# Patient Record
Sex: Female | Born: 2000 | Race: White | Hispanic: No | Marital: Single | Smoking: Never smoker
Health system: Southern US, Community
[De-identification: ages and names within clinical notes are randomized; demographics above are authoritative.]

## PROBLEM LIST (undated history)

## (undated) DIAGNOSIS — F32A Depression, unspecified: Secondary | ICD-10-CM

## (undated) DIAGNOSIS — F419 Anxiety disorder, unspecified: Secondary | ICD-10-CM

## (undated) HISTORY — DX: Depression, unspecified: F32.A

## (undated) HISTORY — DX: Anxiety disorder, unspecified: F41.9

## (undated) HISTORY — PX: SPINE SURGERY: SHX786

---
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
## 2020-12-19 ENCOUNTER — Other Ambulatory Visit (HOSPITAL_COMMUNITY): Payer: Self-pay | Admitting: Physical Medicine & Rehabilitation

## 2020-12-19 ENCOUNTER — Other Ambulatory Visit: Payer: Self-pay | Admitting: Physical Medicine & Rehabilitation

## 2020-12-19 DIAGNOSIS — M546 Pain in thoracic spine: Secondary | ICD-10-CM

## 2020-12-19 DIAGNOSIS — M542 Cervicalgia: Secondary | ICD-10-CM

## 2021-01-01 ENCOUNTER — Other Ambulatory Visit: Payer: Self-pay

## 2021-01-01 ENCOUNTER — Ambulatory Visit
Admission: RE | Admit: 2021-01-01 | Discharge: 2021-01-01 | Disposition: A | Discharge status: Home / Self Care | Payer: 59 | Source: Ambulatory Visit | Attending: Physical Medicine & Rehabilitation | Admitting: Physical Medicine & Rehabilitation

## 2021-01-01 DIAGNOSIS — M542 Cervicalgia: Secondary | ICD-10-CM | POA: Diagnosis present

## 2021-01-01 DIAGNOSIS — M546 Pain in thoracic spine: Secondary | ICD-10-CM | POA: Insufficient documentation

## 2021-01-01 IMAGING — MR MR THORACIC SPINE W/O CM
6 series · 31 of 48 positions shown · non-contrast
Comparison: Spine radiographs [DATE] (report only, images not
available)

CLINICAL DATA: Back pain for 4 years, history of thoracic spine
surgery in [Y8]

EXAM:
MRI CERVICAL AND THORACIC SPINE WITHOUT CONTRAST
TECHNIQUE: Multiplanar and multiecho pulse sequences of the cervical spine, to
include the craniocervical junction and cervicothoracic junction,
and the thoracic spine, were obtained without intravenous contrast.

[Series 18: T1 · sagittal · 6.0mm · 1.41mm/px · 4 of 9 slices shown]
[im 1/9]
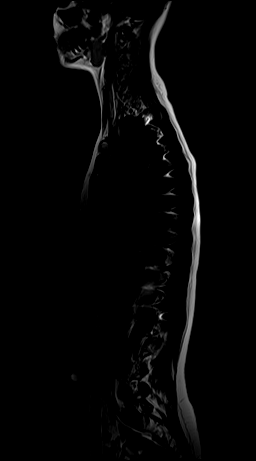
[im 3/9]
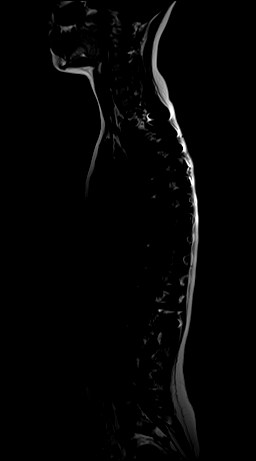
[im 6/9]
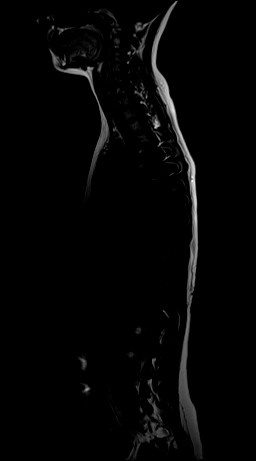
[im 9/9]
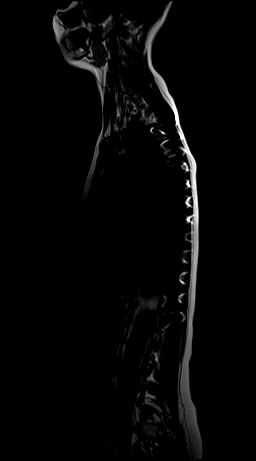

[Series 19: t2_tse_warp_sag · sagittal · 4.0mm · 0.89mm/px · 4 of 15 slices shown]
[im 1/15]
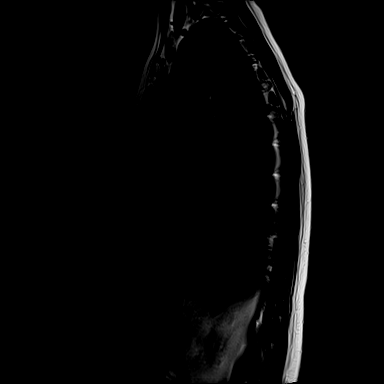
[im 5/15]
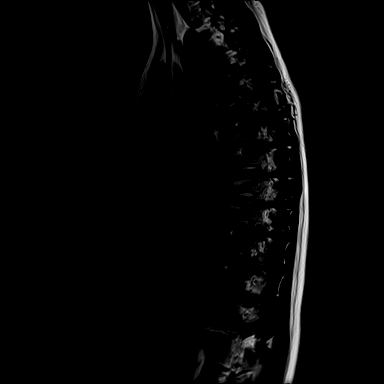
[im 10/15]
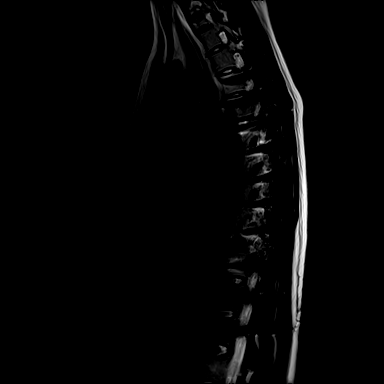
[im 15/15]
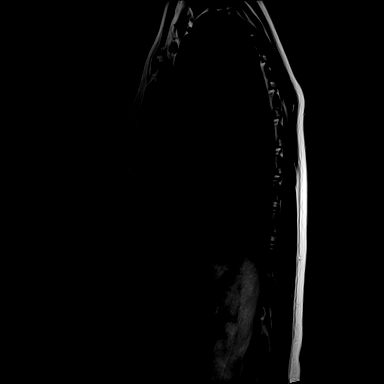

[Series 20: t2_tse_stir_warp_sag · sagittal · 4.0mm · 1.06mm/px · 4 of 15 slices shown]
[im 1/15]
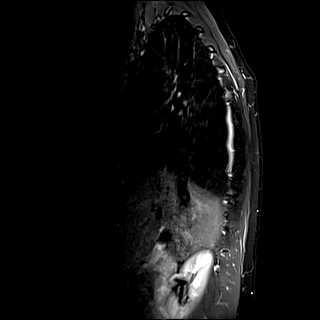
[im 5/15]
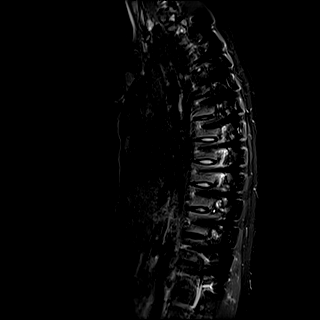
[im 10/15]
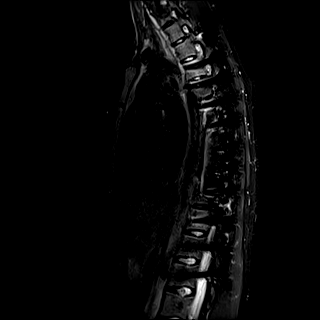
[im 15/15]
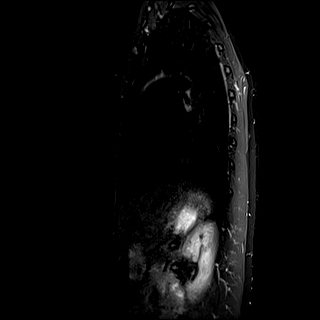

[Series 21: t1_tse_warp_sag · sagittal · 4.0mm · 0.89mm/px · 4 of 15 slices shown]
[im 1/15]
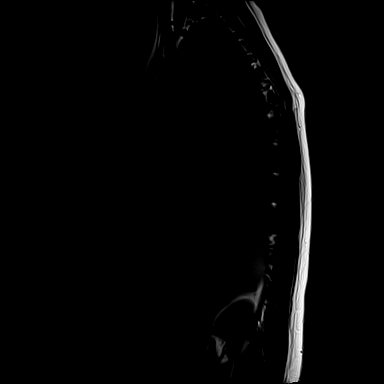
[im 5/15]
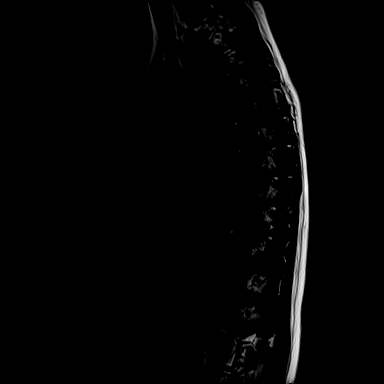
[im 10/15]
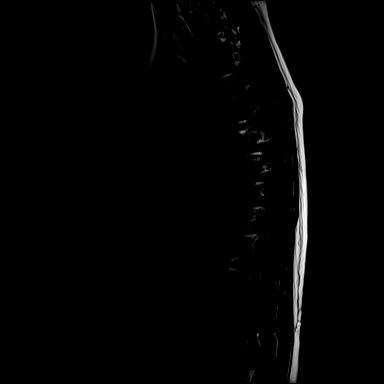
[im 15/15]
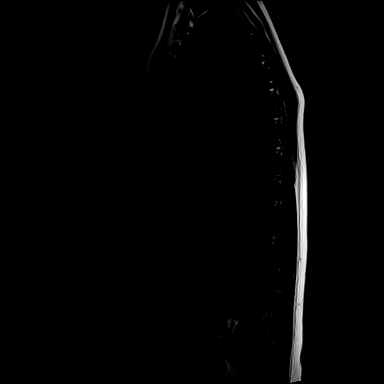

[Series 22: t2_tse_warp_tra · axial · 4.0mm · 0.62mm/px · z∈[-186,+64]mm · 8 of 54 slices shown]
[im 4/54]
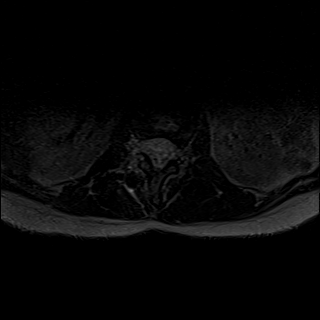
[im 11/54]
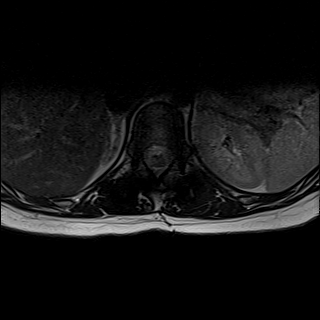
[im 18/54]
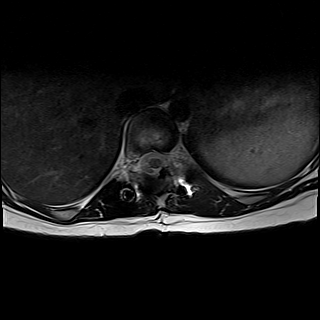
[im 25/54]
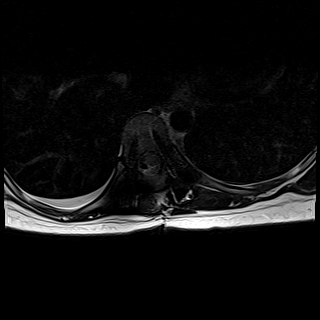
[im 32/54]
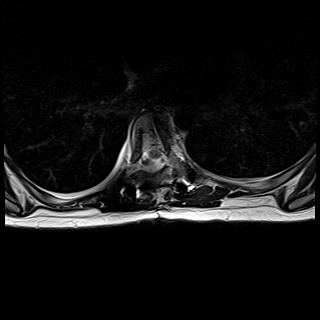
[im 39/54]
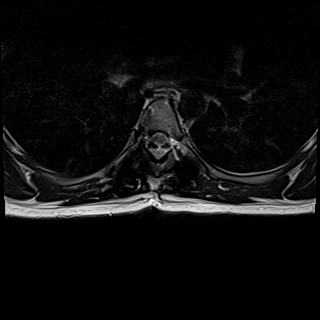
[im 46/54]
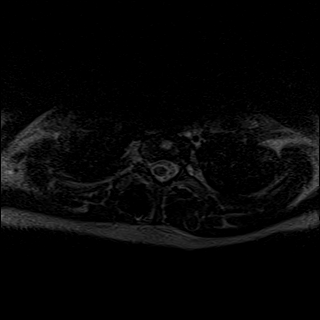
[im 54/54]
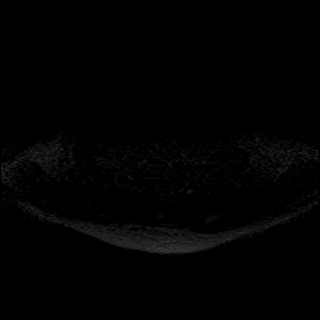

[Series 23: GRE · axial · 4.0mm · 0.37mm/px · z∈[-186,+41]mm · 7 of 54 slices shown]
[im 4/54]
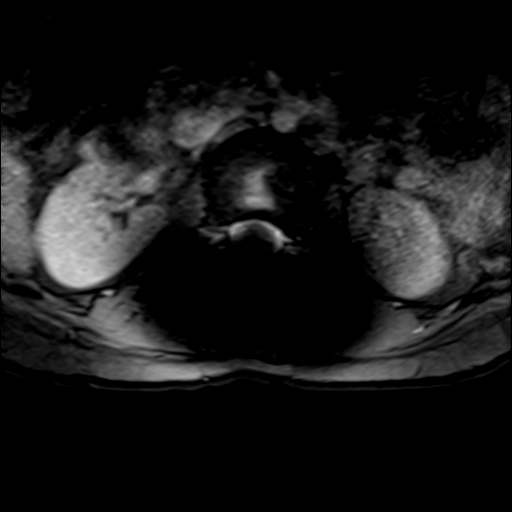
[im 11/54]
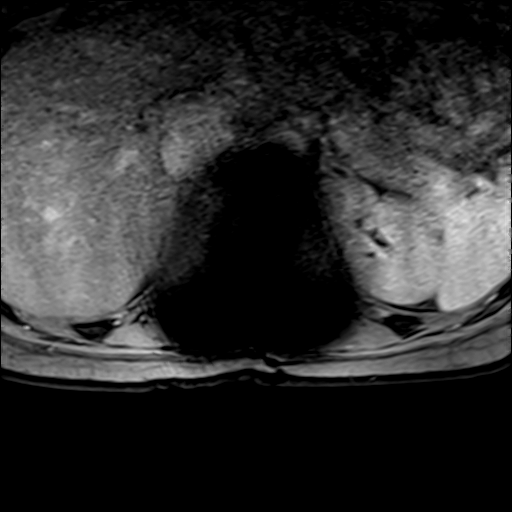
[im 18/54]
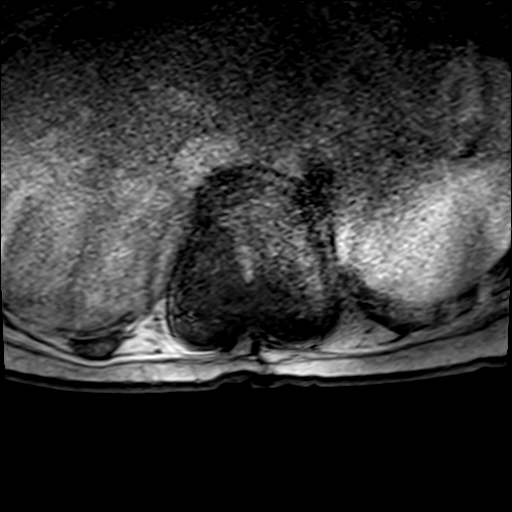
[im 25/54]
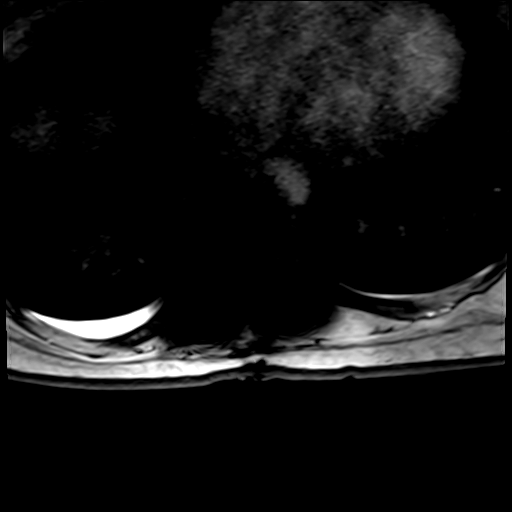
[im 32/54]
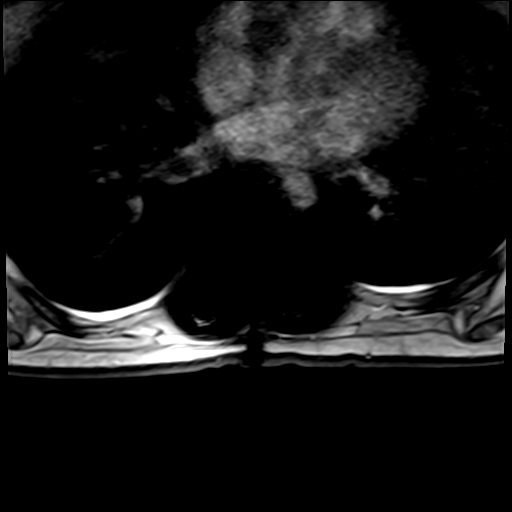
[im 39/54]
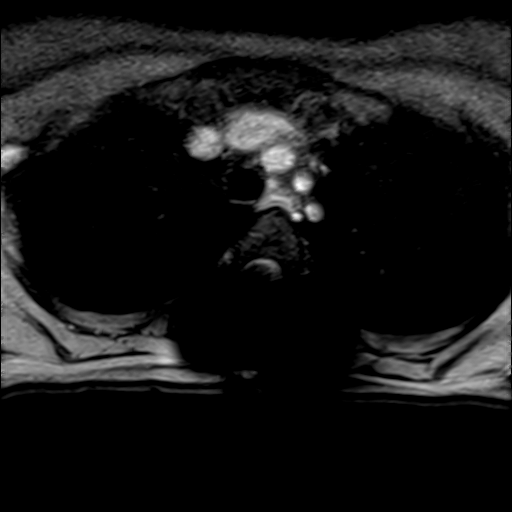
[im 46/54]
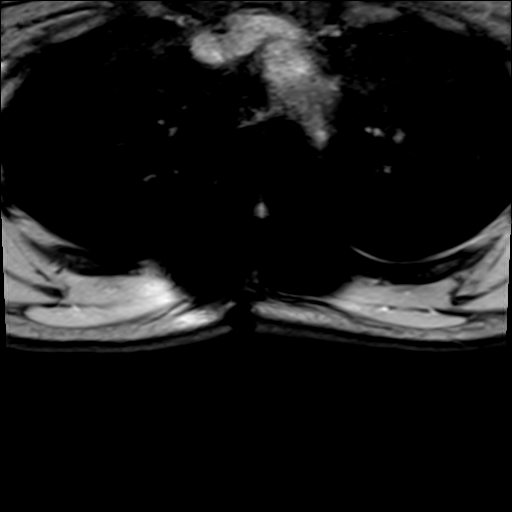

[31 of 48 positions shown; findings below may reference images not displayed]

FINDINGS: MRI CERVICAL SPINE FINDINGS

Alignment: Normal.

Vertebrae: Vertebral body heights are preserved. There is no
suspicious marrow signal abnormality.

Cord: Normal in signal and morphology.

Posterior Fossa, vertebral arteries, paraspinal tissues: Imaged
posterior fossa is unremarkable. The paraspinal soft tissues are
unremarkable. The vertebral artery flow voids are present.

Disc levels:

The intervertebral disc spaces are preserved. There is no disc
herniation. There is no significant spinal canal or neural foraminal
stenosis.

MRI THORACIC SPINE FINDINGS

Alignment: There is S shaped scoliosis of the thoracic spine with
levoscoliosis centered at T3-T4 and dextroscoliosis centered at
T8-T9. There is no antero or retrolisthesis.

Vertebrae: Postsurgical changes reflecting posterior fusion spanning
from T4 through L1 are seen. Hardware alignment is grossly within
expected limits. Vertebral body heights are preserved. There is no
marrow signal abnormality.

Cord: No cord signal abnormality is identified. The cord is normal
in morphology.

Paraspinal and other soft tissues: There are small right and trace
left pleural effusions. The paraspinal soft tissues are
unremarkable.

Disc levels:

The disc spaces are preserved. There is no disc herniation. There is
no significant spinal canal or neural foraminal stenosis.
IMPRESSION: 1. Unremarkable cervical spine MRI.
2. Status post posterior fusion spanning from T4 through L1. There
is residual S shaped scoliosis of the thoracic spine with
levoscoliosis centered at T3-T4 and dextroscoliosis centered at
T8-T9.
3. Otherwise, no acute findings in the thoracic spine. No
significant spinal canal or neural foraminal stenosis.
4. Small right and trace left pleural effusions.

## 2021-01-01 IMAGING — MR MR CERVICAL SPINE W/O CM
5 series · 38 of 48 positions shown · non-contrast
Comparison: Spine radiographs [DATE] (report only, images not
available)

CLINICAL DATA: Back pain for 4 years, history of thoracic spine
surgery in [Y8]

EXAM:
MRI CERVICAL AND THORACIC SPINE WITHOUT CONTRAST
TECHNIQUE: Multiplanar and multiecho pulse sequences of the cervical spine, to
include the craniocervical junction and cervicothoracic junction,
and the thoracic spine, were obtained without intravenous contrast.

[Series 1: T2 · sagittal · 3.0mm · 0.62mm/px · 6 of 15 slices shown (1 of 2)]
[im 1/15]
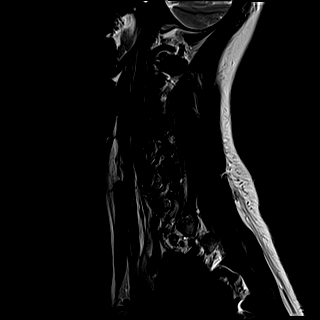
[im 3/15]
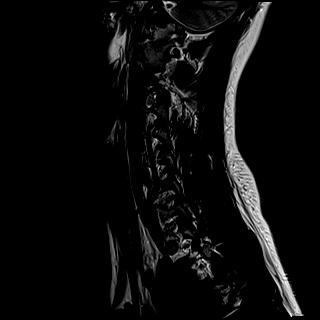
[im 6/15]
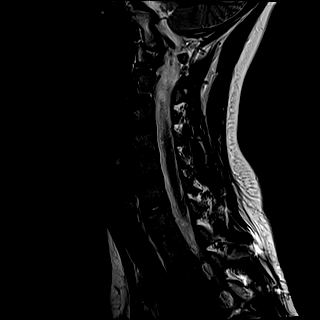
[im 9/15]
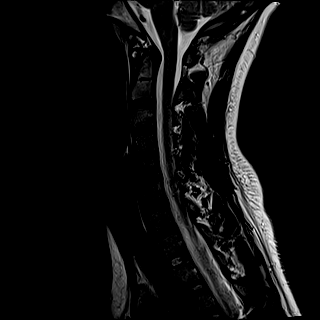
[im 12/15]
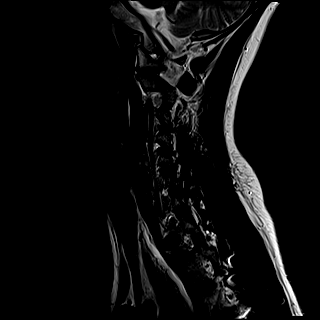
[im 15/15]
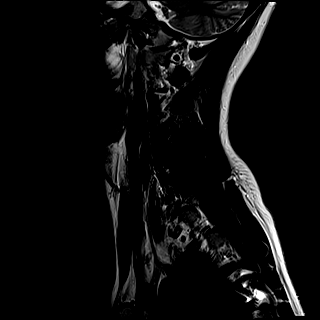

[Series 2: FLAIR · sagittal · 3.0mm · 0.78mm/px · 7 of 15 slices shown]
[im 1/15]
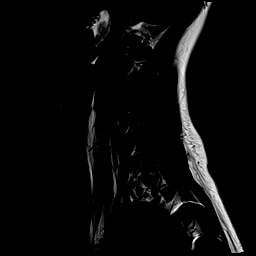
[im 3/15]
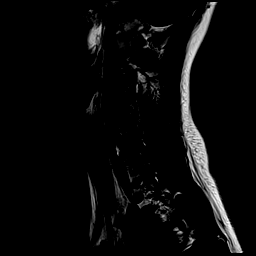
[im 5/15]
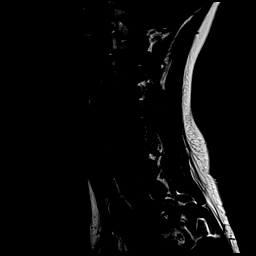
[im 8/15]
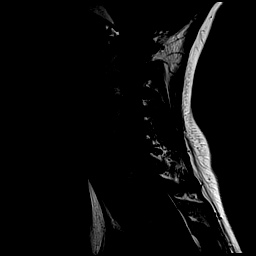
[im 10/15]
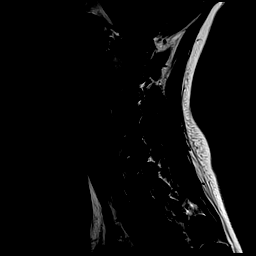
[im 12/15]
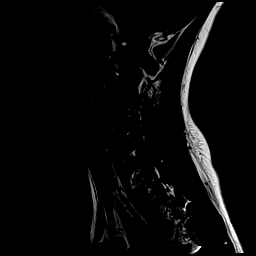
[im 15/15]
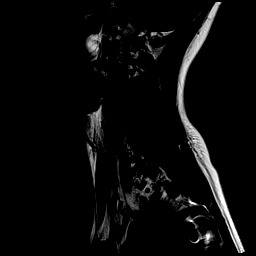

[Series 3: STIR · sagittal · 3.0mm · 0.62mm/px · 7 of 15 slices shown]
[im 1/15]
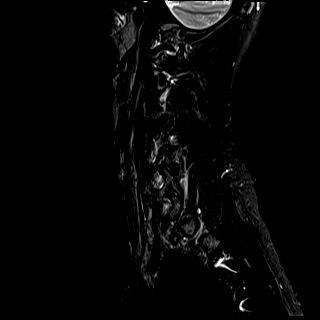
[im 3/15]
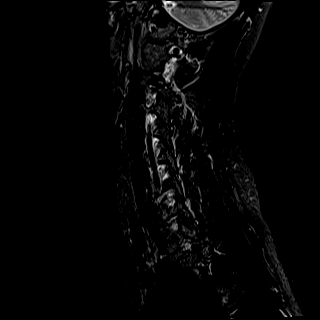
[im 5/15]
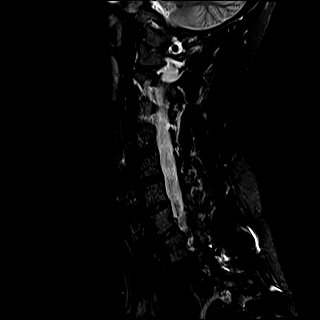
[im 8/15]
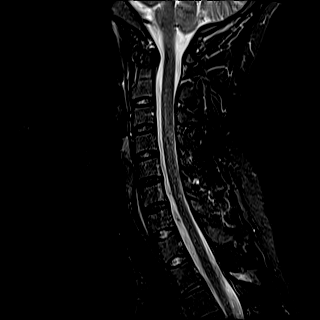
[im 10/15]
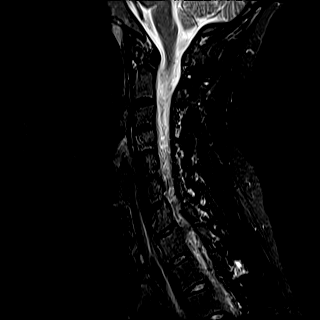
[im 12/15]
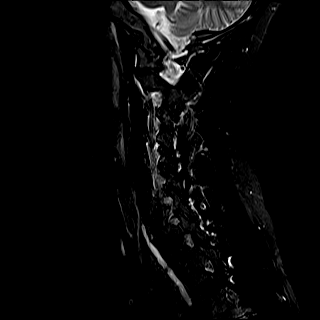
[im 15/15]
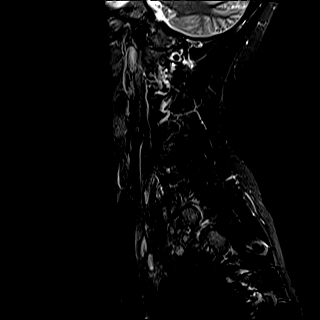

[Series 4: T2 · axial · 3.0mm · 0.70mm/px · z∈[+80,+177]mm · 10 of 29 slices shown (2 of 2)]
[im 1/29]
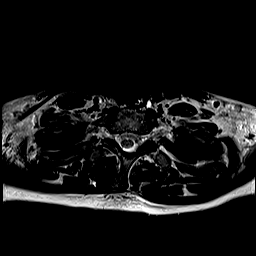
[im 3/29]
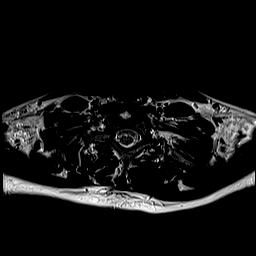
[im 5/29]
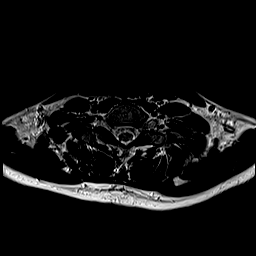
[im 7/29]
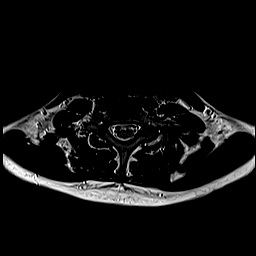
[im 9/29]
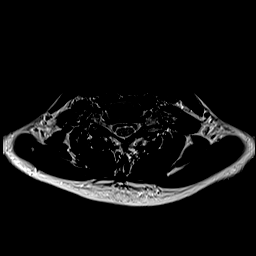
[im 13/29]
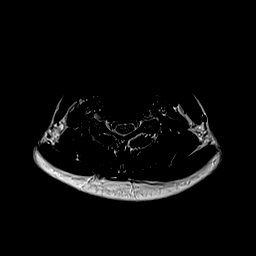
[im 16/29]
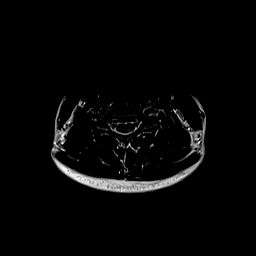
[im 20/29]
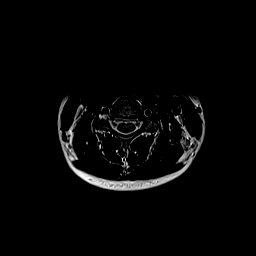
[im 24/29]
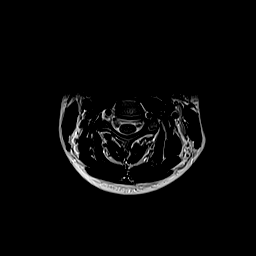
[im 29/29]
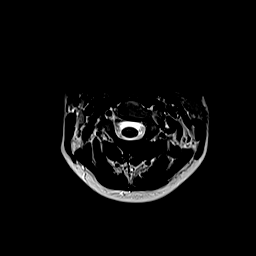

[Series 5: ax mpgr · axial · 3.0mm · 0.35mm/px · z∈[+80,+177]mm · 8 of 29 slices shown]
[im 1/29]
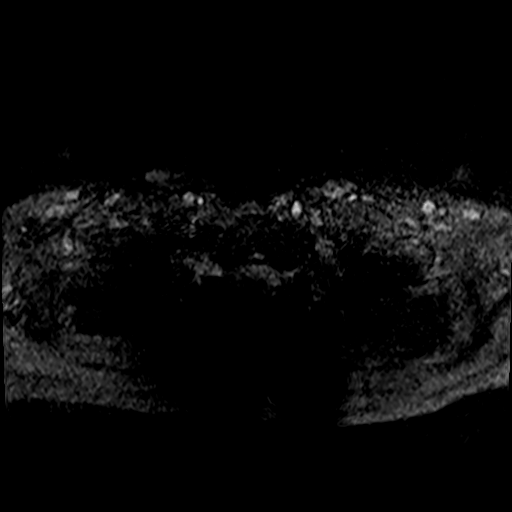
[im 5/29]
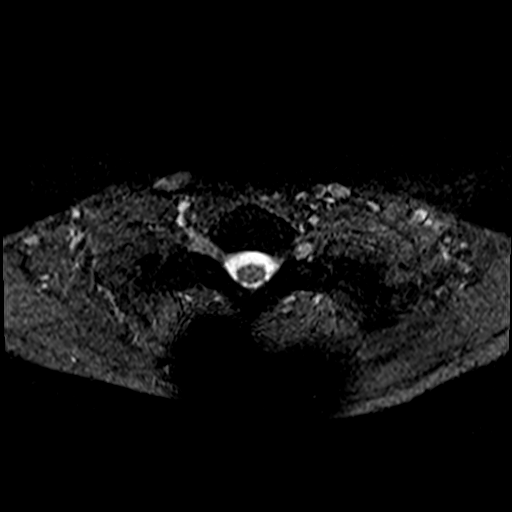
[im 9/29]
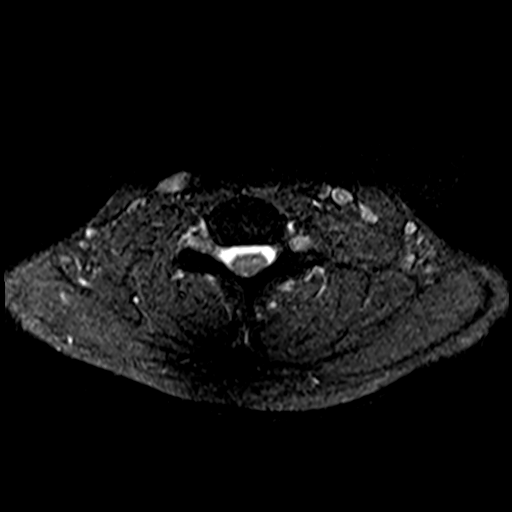
[im 13/29]
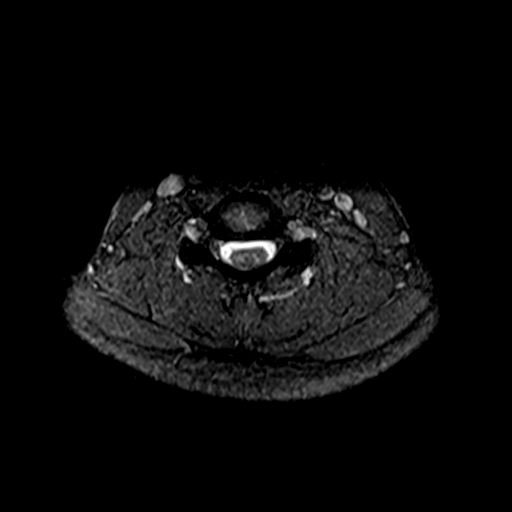
[im 16/29]
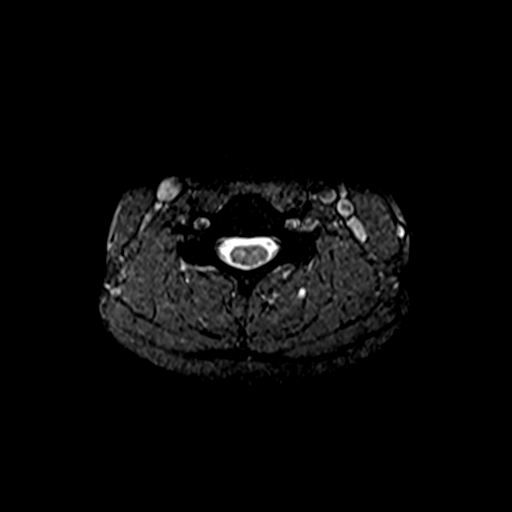
[im 20/29]
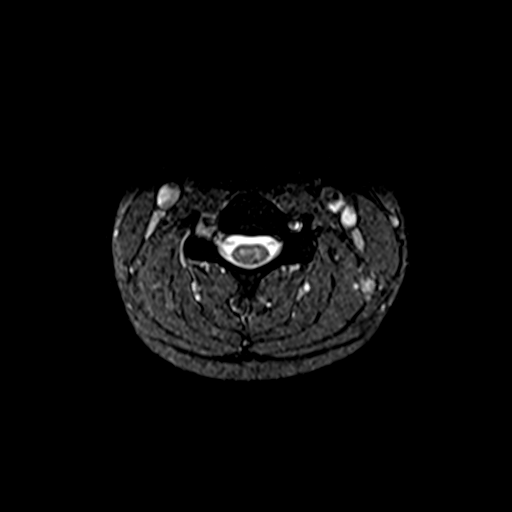
[im 24/29]
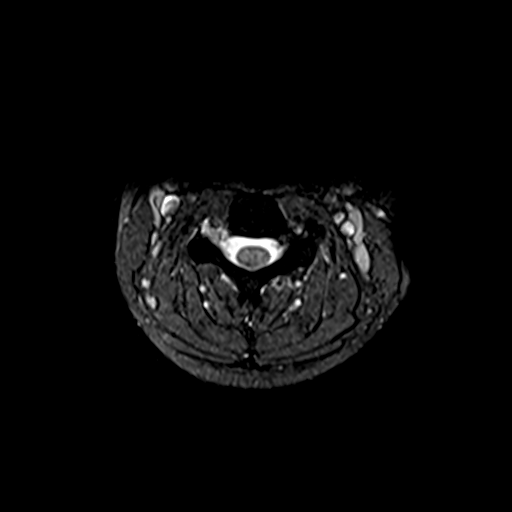
[im 29/29]
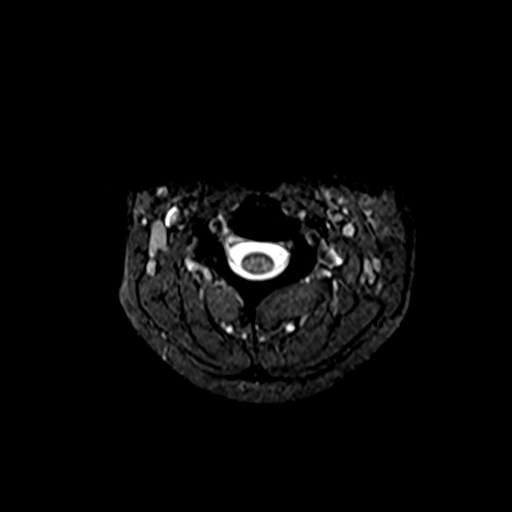

[38 of 48 positions shown; findings below may reference images not displayed]

FINDINGS: MRI CERVICAL SPINE FINDINGS

Alignment: Normal.

Vertebrae: Vertebral body heights are preserved. There is no
suspicious marrow signal abnormality.

Cord: Normal in signal and morphology.

Posterior Fossa, vertebral arteries, paraspinal tissues: Imaged
posterior fossa is unremarkable. The paraspinal soft tissues are
unremarkable. The vertebral artery flow voids are present.

Disc levels:

The intervertebral disc spaces are preserved. There is no disc
herniation. There is no significant spinal canal or neural foraminal
stenosis.

MRI THORACIC SPINE FINDINGS

Alignment: There is S shaped scoliosis of the thoracic spine with
levoscoliosis centered at T3-T4 and dextroscoliosis centered at
T8-T9. There is no antero or retrolisthesis.

Vertebrae: Postsurgical changes reflecting posterior fusion spanning
from T4 through L1 are seen. Hardware alignment is grossly within
expected limits. Vertebral body heights are preserved. There is no
marrow signal abnormality.

Cord: No cord signal abnormality is identified. The cord is normal
in morphology.

Paraspinal and other soft tissues: There are small right and trace
left pleural effusions. The paraspinal soft tissues are
unremarkable.

Disc levels:

The disc spaces are preserved. There is no disc herniation. There is
no significant spinal canal or neural foraminal stenosis.
IMPRESSION: 1. Unremarkable cervical spine MRI.
2. Status post posterior fusion spanning from T4 through L1. There
is residual S shaped scoliosis of the thoracic spine with
levoscoliosis centered at T3-T4 and dextroscoliosis centered at
T8-T9.
3. Otherwise, no acute findings in the thoracic spine. No
significant spinal canal or neural foraminal stenosis.
4. Small right and trace left pleural effusions.

## 2022-03-21 DIAGNOSIS — M4325 Fusion of spine, thoracolumbar region: Secondary | ICD-10-CM | POA: Insufficient documentation

## 2022-08-07 DIAGNOSIS — M549 Dorsalgia, unspecified: Secondary | ICD-10-CM | POA: Insufficient documentation

## 2022-10-16 DIAGNOSIS — F331 Major depressive disorder, recurrent, moderate: Secondary | ICD-10-CM | POA: Insufficient documentation

## 2022-10-16 DIAGNOSIS — F411 Generalized anxiety disorder: Secondary | ICD-10-CM | POA: Insufficient documentation

## 2023-01-22 ENCOUNTER — Other Ambulatory Visit: Payer: Self-pay | Admitting: Internal Medicine

## 2023-01-22 DIAGNOSIS — M542 Cervicalgia: Secondary | ICD-10-CM

## 2023-01-22 DIAGNOSIS — M545 Low back pain, unspecified: Secondary | ICD-10-CM

## 2023-01-28 ENCOUNTER — Ambulatory Visit
Admission: RE | Admit: 2023-01-28 | Discharge: 2023-01-28 | Disposition: A | Discharge status: Home / Self Care | Payer: BC Managed Care – PPO | Source: Ambulatory Visit | Attending: Internal Medicine | Admitting: Internal Medicine

## 2023-01-28 DIAGNOSIS — M542 Cervicalgia: Secondary | ICD-10-CM | POA: Diagnosis present

## 2023-01-28 DIAGNOSIS — G8929 Other chronic pain: Secondary | ICD-10-CM | POA: Insufficient documentation

## 2023-01-28 DIAGNOSIS — M545 Low back pain, unspecified: Secondary | ICD-10-CM | POA: Insufficient documentation

## 2023-02-09 ENCOUNTER — Encounter: Payer: Self-pay | Admitting: Student in an Organized Health Care Education/Training Program

## 2023-02-09 ENCOUNTER — Ambulatory Visit
Payer: BC Managed Care – PPO | Attending: Student in an Organized Health Care Education/Training Program | Admitting: Student in an Organized Health Care Education/Training Program

## 2023-02-09 VITALS — BP 107/75 | HR 87 | Temp 97.1°F | Resp 16 | Ht 61.0 in | Wt 116.3 lb

## 2023-02-09 DIAGNOSIS — G8928 Other chronic postprocedural pain: Secondary | ICD-10-CM

## 2023-02-09 DIAGNOSIS — M549 Dorsalgia, unspecified: Secondary | ICD-10-CM | POA: Diagnosis not present

## 2023-02-09 DIAGNOSIS — M7918 Myalgia, other site: Secondary | ICD-10-CM

## 2023-02-09 DIAGNOSIS — M4325 Fusion of spine, thoracolumbar region: Secondary | ICD-10-CM

## 2023-02-09 DIAGNOSIS — F331 Major depressive disorder, recurrent, moderate: Secondary | ICD-10-CM | POA: Diagnosis not present

## 2023-02-09 DIAGNOSIS — M41125 Adolescent idiopathic scoliosis, thoracolumbar region: Secondary | ICD-10-CM | POA: Diagnosis present

## 2023-02-09 DIAGNOSIS — F411 Generalized anxiety disorder: Secondary | ICD-10-CM

## 2023-02-09 NOTE — Patient Instructions (Signed)
GENERAL RISKS AND COMPLICATIONS ° °What are the risk, side effects and possible complications? °Generally speaking, most procedures are safe.  However, with any procedure there are risks, side effects, and the possibility of complications.  The risks and complications are dependent upon the sites that are lesioned, or the type of nerve block to be performed.  The closer the procedure is to the spine, the more serious the risks are.  Great care is taken when placing the radio frequency needles, block needles or lesioning probes, but sometimes complications can occur. °Infection: Any time there is an injection through the skin, there is a risk of infection.  This is why sterile conditions are used for these blocks.  There are four possible types of infection. °Localized skin infection. °Central Nervous System Infection-This can be in the form of Meningitis, which can be deadly. °Epidural Infections-This can be in the form of an epidural abscess, which can cause pressure inside of the spine, causing compression of the spinal cord with subsequent paralysis. This would require an emergency surgery to decompress, and there are no guarantees that the patient would recover from the paralysis. °Discitis-This is an infection of the intervertebral discs.  It occurs in about 1% of discography procedures.  It is difficult to treat and it may lead to surgery. ° °      2. Pain: the needles have to go through skin and soft tissues, will cause soreness. °      3. Damage to internal structures:  The nerves to be lesioned may be near blood vessels or   ° other nerves which can be potentially damaged. °      4. Bleeding: Bleeding is more common if the patient is taking blood thinners such as  aspirin, Coumadin, Ticiid, Plavix, etc., or if he/she have some genetic predisposition  such as hemophilia. Bleeding into the spinal canal can cause compression of the spinal  cord with subsequent paralysis.  This would require an emergency  surgery to  decompress and there are no guarantees that the patient would recover from the  paralysis. °      5. Pneumothorax:  Puncturing of a lung is a possibility, every time a needle is introduced in  the area of the chest or upper back.  Pneumothorax refers to free air around the  collapsed lung(s), inside of the thoracic cavity (chest cavity).  Another two possible  complications related to a similar event would include: Hemothorax and Chylothorax.   These are variations of the Pneumothorax, where instead of air around the collapsed  lung(s), you may have blood or chyle, respectively. °      6. Spinal headaches: They may occur with any procedures in the area of the spine. °      7. Persistent CSF (Cerebro-Spinal Fluid) leakage: This is a rare problem, but may occur  with prolonged intrathecal or epidural catheters either due to the formation of a fistulous  track or a dural tear. °      8. Nerve damage: By working so close to the spinal cord, there is always a possibility of  nerve damage, which could be as serious as a permanent spinal cord injury with  paralysis. °      9. Death:  Although rare, severe deadly allergic reactions known as "Anaphylactic  reaction" can occur to any of the medications used. °     10. Worsening of the symptoms:  We can always make thing worse. ° °What are the chances   of something like this happening? °Chances of any of this occuring are extremely low.  By statistics, you have more of a chance of getting killed in a motor vehicle accident: while driving to the hospital than any of the above occurring .  Nevertheless, you should be aware that they are possibilities.  In general, it is similar to taking a shower.  Everybody knows that you can slip, hit your head and get killed.  Does that mean that you should not shower again?  Nevertheless always keep in mind that statistics do not mean anything if you happen to be on the wrong side of them.  Even if a procedure has a 1 (one) in a  1,000,000 (million) chance of going wrong, it you happen to be that one..Also, keep in mind that by statistics, you have more of a chance of having something go wrong when taking medications. ° °Who should not have this procedure? °If you are on a blood thinning medication (e.g. Coumadin, Plavix, see list of "Blood Thinners"), or if you have an active infection going on, you should not have the procedure.  If you are taking any blood thinners, please inform your physician. ° °How should I prepare for this procedure? °Do not eat or drink anything at least six hours prior to the procedure. °Bring a driver with you .  It cannot be a taxi. °Come accompanied by an adult that can drive you back, and that is strong enough to help you if your legs get weak or numb from the local anesthetic. °Take all of your medicines the morning of the procedure with just enough water to swallow them. °If you have diabetes, make sure that you are scheduled to have your procedure done first thing in the morning, whenever possible. °If you have diabetes, take only half of your insulin dose and notify our nurse that you have done so as soon as you arrive at the clinic. °If you are diabetic, but only take blood sugar pills (oral hypoglycemic), then do not take them on the morning of your procedure.  You may take them after you have had the procedure. °Do not take aspirin or any aspirin-containing medications, at least eleven (11) days prior to the procedure.  They may prolong bleeding. °Wear loose fitting clothing that may be easy to take off and that you would not mind if it got stained with Betadine or blood. °Do not wear any jewelry or perfume °Remove any nail coloring.  It will interfere with some of our monitoring equipment. ° °NOTE: Remember that this is not meant to be interpreted as a complete list of all possible complications.  Unforeseen problems may occur. ° °BLOOD THINNERS °The following drugs contain aspirin or other products,  which can cause increased bleeding during surgery and should not be taken for 2 weeks prior to and 1 week after surgery.  If you should need take something for relief of minor pain, you may take acetaminophen which is found in Tylenol,m Datril, Anacin-3 and Panadol. It is not blood thinner. The products listed below are.  Do not take any of the products listed below in addition to any listed on your instruction sheet. ° °A.P.C or A.P.C with Codeine Codeine Phosphate Capsules #3 Ibuprofen Ridaura  °ABC compound Congesprin Imuran rimadil  °Advil Cope Indocin Robaxisal  °Alka-Seltzer Effervescent Pain Reliever and Antacid Coricidin or Coricidin-D ° Indomethacin Rufen  °Alka-Seltzer plus Cold Medicine Cosprin Ketoprofen S-A-C Tablets  °Anacin Analgesic Tablets or Capsules Coumadin   Korlgesic Salflex  Anacin Extra Strength Analgesic tablets or capsules CP-2 Tablets Lanoril Salicylate  Anaprox Cuprimine Capsules Levenox Salocol  Anexsia-D Dalteparin Magan Salsalate  Anodynos Darvon compound Magnesium Salicylate Sine-off  Ansaid Dasin Capsules Magsal Sodium Salicylate  Anturane Depen Capsules Marnal Soma  APF Arthritis pain formula Dewitt's Pills Measurin Stanback  Argesic Dia-Gesic Meclofenamic Sulfinpyrazone  Arthritis Bayer Timed Release Aspirin Diclofenac Meclomen Sulindac  Arthritis pain formula Anacin Dicumarol Medipren Supac  Analgesic (Safety coated) Arthralgen Diffunasal Mefanamic Suprofen  Arthritis Strength Bufferin Dihydrocodeine Mepro Compound Suprol  Arthropan liquid Dopirydamole Methcarbomol with Aspirin Synalgos  ASA tablets/Enseals Disalcid Micrainin Tagament  Ascriptin Doan's Midol Talwin  Ascriptin A/D Dolene Mobidin Tanderil  Ascriptin Extra Strength Dolobid Moblgesic Ticlid  Ascriptin with Codeine Doloprin or Doloprin with Codeine Momentum Tolectin  Asperbuf Duoprin Mono-gesic Trendar  Aspergum Duradyne Motrin or Motrin IB Triminicin  Aspirin plain, buffered or enteric coated  Durasal Myochrisine Trigesic  Aspirin Suppositories Easprin Nalfon Trillsate  Aspirin with Codeine Ecotrin Regular or Extra Strength Naprosyn Uracel  Atromid-S Efficin Naproxen Ursinus  Auranofin Capsules Elmiron Neocylate Vanquish  Axotal Emagrin Norgesic Verin  Azathioprine Empirin or Empirin with Codeine Normiflo Vitamin E  Azolid Emprazil Nuprin Voltaren  Bayer Aspirin plain, buffered or children's or timed BC Tablets or powders Encaprin Orgaran Warfarin Sodium  Buff-a-Comp Enoxaparin Orudis Zorpin  Buff-a-Comp with Codeine Equegesic Os-Cal-Gesic   Buffaprin Excedrin plain, buffered or Extra Strength Oxalid   Bufferin Arthritis Strength Feldene Oxphenbutazone   Bufferin plain or Extra Strength Feldene Capsules Oxycodone with Aspirin   Bufferin with Codeine Fenoprofen Fenoprofen Pabalate or Pabalate-SF   Buffets II Flogesic Panagesic   Buffinol plain or Extra Strength Florinal or Florinal with Codeine Panwarfarin   Buf-Tabs Flurbiprofen Penicillamine   Butalbital Compound Four-way cold tablets Penicillin   Butazolidin Fragmin Pepto-Bismol   Carbenicillin Geminisyn Percodan   Carna Arthritis Reliever Geopen Persantine   Carprofen Gold's salt Persistin   Chloramphenicol Goody's Phenylbutazone   Chloromycetin Haltrain Piroxlcam   Clmetidine heparin Plaquenil   Cllnoril Hyco-pap Ponstel   Clofibrate Hydroxy chloroquine Propoxyphen         Before stopping any of these medications, be sure to consult the physician who ordered them.  Some, such as Coumadin (Warfarin) are ordered to prevent or treat serious conditions such as "deep thrombosis", "pumonary embolisms", and other heart problems.  The amount of time that you may need off of the medication may also vary with the medication and the reason for which you were taking it.  If you are taking any of these medications, please make sure you notify your pain physician before you undergo any procedures.         Trigger Point  Injections Patient Information  Description: Trigger points are areas of muscle sensitive to touch which cause pain with movement, sometimes felt some distance from the site of palpation.  Usually the muscle containing these trigger points if felt as a tight band or knot.   The area of maximum tenderness or trigger point is identified, and after antiseptic preparation of the skin, a small needle is placed into this site.  Reproduction of the pain often occurs and numbing medicine (local anesthetic) is injected into the site, sometimes along with steroid preparation.  The entire block usually lasts less than 5 minutes.  Conditions which may be treated by trigger points:  Muscular pain and spasm Nerve irritation  Preparation for the injection:  Do not eat any solid food or dairy products within 8 hours  of your appointment. You may drink clear liquids up to 3 hours before appointment.  Clear liquids include water, black coffee, juice or soda.  No milk or cream please. You may take your regular medications, including pain medications, with a sip of water before your appointment.  Diabetics should hold regular insulin ( if take separately) and take 1/2 normal NPH dose the morning of the procedure.  Carry some sugar containing items with you to your appointment. A driver must accompany you and be prepared to drive you home after your procedure.  Bring all your current medications with you. An IV may be inserted and sedation may be given at the discretion of the physician.  A blood pressure cuff, EKG, and other monitors will often be applied during the procedure.  Some patients may need to have extra oxygen administered for a short period. You will be asked to provide medical information, including your allergies and medications, prior to the procedure.  We must know immediately if you are taking blood thinners (like Coumadin/Warfarin) or if you are allergic to IV iodine contrast (dye).  We must know if  you could possibly be pregnant.  Possible side-effects:  Bleeding from needle site Infection (rare, may require surgery) Nerve injury (rare) Numbness & tingling (temporary) Punctured lung (if injection around chest) Light-headedness (temporary) Pain at injection site (several days) Decreased blood pressure (rare, temporary) Weakness in arm/leg (temporary)  Call if you experience:  Hive or difficulty breathing (go to the emergency room) Inflammation or drainage at the injection site(s)  Please note:  Although the local anesthetic injected can often make your painful muscle feel good for several hours after the injection, the pain may return.  It takes 3-7 days for steroids to work.  You may not notice any pain relief for at least one week.  If effective, we will often do a series of injections spaced 3-6 weeks apart to maximally decrease your pain.  If you have any questions please call [phone] [facility]

## 2023-02-09 NOTE — Progress Notes (Signed)
 Patient: Patricia Joyce  Service Category: E/M  Provider: Wallie Sherry, MD  DOB: 02-28-2000  DOS: 02/09/2023  Referring Provider: Sherial Bail, MD  MRN: [id number]  Setting: Ambulatory outpatient  PCP: Sherial Bail, MD  Type: New Patient  Specialty: Interventional Pain Management    Location: Office  Delivery: Face-to-face     Primary Reason(s) for Visit: Encounter for initial evaluation of one or more chronic problems (new to examiner) potentially causing chronic pain, and posing a threat to normal musculoskeletal function. (Level of risk: High) CC: Back Pain, Neck Pain, and Shoulder Pain (left)  HPI  Patricia Joyce is a 22 y.o. year old, female patient, who comes for the first time to our practice referred by Sherial Bail, MD for our initial evaluation of her chronic pain. 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 Today she comes in for evaluation of her Back Pain, Neck Pain, and Shoulder Pain (left)  Pain Assessment: Location: Lower, Mid, Upper Back Radiating: to neck, head and left shoulder Onset: More than a month ago Duration: Chronic pain Quality: Aching, Dull, Burning (pinching) Severity: 7 /10 (subjective, self-reported pain score)  Effect on ADL: limits daily activities Timing: Constant Modifying factors: nothing helps BP: 107/75  HR: 87  Onset and Duration: Sudden and Present longer than 3 months Cause of pain: Surgery Severity: Getting worse, No change since onset, NAS-11 at its worse: 8/10, NAS-11 at its best: 5/10, NAS-11 now: 7/10, and NAS-11 on the average: 7/10 Timing: Night, During activity or exercise, and After activity or exercise Aggravating Factors: Motion, Prolonged sitting, Prolonged standing, Surgery made it worse, and  Working Alleviating Factors: Lying down Associated Problems: Depression, Fatigue, Personality changes, Sadness, Spasms, Sweating, Pain that wakes patient up, and Pain that does not allow patient to sleep Quality of Pain: Aching, Agonizing, Annoying, Burning, Constant, Deep, Distressing, Dreadful, Dull, Exhausting, Feeling of constriction, Horrible, Pressure-like, Pulsating, Punishing, Throbbing, and Uncomfortable Previous Examinations or Tests: CT scan, Nerve block, X-rays, and Chiropractic evaluation Previous Treatments: Biofeedback, Chiropractic manipulations, Facet blocks, Physical Therapy, Relaxation therapy, Strengthening exercises, Stretching exercises, and TENS  Patricia Joyce is being evaluated for possible interventional pain management therapies for the treatment of her chronic pain.  Discussed the use of AI scribe software for clinical note transcription with the patient, who gave verbal consent to proceed.  History of Present Illness   Patricia Joyce, a 22 year old female, presents with chronic back pain extending from her lower spine to her neck and occiput, radiating to her left shoulder. The pain began following a thoracic  (T4-L1)spinal fusion surgery performed in 2018 to correct severe scoliosis. Since the surgery, she has experienced a constant dull ache, which has recently evolved into a persistent pinching sensation. This new symptom began in June, subsided after a week, but returned in September during her menstrual cycle and has been constant since. The pain occasionally radiates to her mid-left flank and she reports a sensation of a knot in her left shoulder.  There has been discussion about potential loosening of the surgical hardware, and she is scheduled for surgery at the end of February to remove the hardware.  The pain is described as a burning, pinching sensation in the center of her back, which she also describes as a catching pain.  She has been on Cymbalta 60mg  and Flexeril  for pain management.  She has also undergone physical therapy and facet blocks, which did not provide relief. Her depression screening was significant, and she is currently receiving treatment.  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 She has an associate's degree and graduated from high school two years ago.       Meds   Current Outpatient Medications:    acetaminophen (TYLENOL) 500 MG tablet, Take 500 mg by mouth every 6 (six) hours as needed., Disp: , Rfl:    buPROPion (WELLBUTRIN SR) 150 MG 12 hr tablet, Take 150 mg by mouth 2 (two) times daily., Disp: , Rfl:    cetirizine (ZYRTEC) 5 MG tablet, Take 5 mg by mouth daily as needed for allergies., Disp: , Rfl:    cyclobenzaprine (FLEXERIL) 5 MG tablet, Take 5 mg by mouth 3 (three) times daily as needed for muscle spasms., Disp: , Rfl:    DULoxetine (CYMBALTA) 60 MG capsule, Take 60 mg by mouth 2 (two) times daily., Disp: , Rfl:    lidocaine (LIDODERM) 5 %, Place 1 patch onto the skin daily. Remove & Discard patch within 12 hours or as directed by MD, Disp: , Rfl:   Imaging Review  Cervical Imaging: Cervical MR wo contrast: Results for orders placed during the hospital encounter of 01/01/21  MR CERVICAL SPINE WO CONTRAST  Narrative CLINICAL DATA:  Back pain for 4 years, history of thoracic spine surgery in 2018  EXAM: MRI CERVICAL AND THORACIC SPINE WITHOUT CONTRAST  TECHNIQUE: Multiplanar and multiecho pulse sequences of the cervical spine, to include the craniocervical junction and cervicothoracic junction, and the thoracic spine, were obtained without intravenous contrast.  COMPARISON:  Spine radiographs 06/26/2016 (report only, images not available)  FINDINGS: MRI CERVICAL SPINE FINDINGS  Alignment: Normal.  Vertebrae: Vertebral body heights are preserved. There is no suspicious marrow signal abnormality.  Cord: Normal in signal and morphology.  Posterior Fossa, vertebral  arteries, paraspinal tissues: Imaged posterior fossa is unremarkable. The paraspinal soft tissues are unremarkable. The vertebral artery flow voids are present.  Disc levels:  The intervertebral disc spaces are preserved. There is no disc herniation. There is no significant spinal canal or neural foraminal stenosis.  MRI THORACIC SPINE FINDINGS  Alignment: There is S shaped scoliosis of the thoracic spine with levoscoliosis centered at T3-T4 and dextroscoliosis centered at T8-T9. There is no antero or retrolisthesis.  Vertebrae: Postsurgical changes reflecting posterior fusion spanning from T4 through L1 are seen. Hardware alignment is grossly within expected limits. Vertebral body heights are preserved. There is no marrow signal abnormality.  Cord: No cord signal abnormality is identified. The cord is normal in morphology.  Paraspinal and other soft tissues: There are small right and trace left pleural effusions. The paraspinal soft tissues are unremarkable.  Disc levels:  The disc spaces are preserved. There is no disc herniation. There is no significant spinal canal or neural foraminal stenosis.  IMPRESSION: 1. Unremarkable cervical spine MRI. 2. Status post posterior fusion spanning from T4 through L1. There is residual S shaped scoliosis of the thoracic spine with levoscoliosis centered at T3-T4 and dextroscoliosis centered at T8-T9. 3. Otherwise, no acute findings in the thoracic spine. No significant spinal canal or neural foraminal stenosis. 4. Small right and trace left pleural effusions.   Electronically Signed By: Maude Harry M.D. On: 01/03/2021 09:32   CT CERVICAL SPINE WO CONTRAST  Narrative CLINICAL DATA:  Cervicalgia, left neck pain with some left hand tingling  EXAM:  CT CERVICAL SPINE WITHOUT CONTRAST  TECHNIQUE: Multidetector CT imaging of the cervical spine was performed without intravenous contrast. Multiplanar CT image reconstructions  were also generated.  RADIATION DOSE REDUCTION: This exam was performed according to the departmental dose-optimization program which includes automated exposure control, adjustment of the mA and/or kV according to patient size and/or use of iterative reconstruction technique.  COMPARISON:  No prior CT of the cervical spine available, correlation is made with MRI cervical spine 01/01/2021  FINDINGS: Alignment: Straightening of the normal cervical lordosis. No listhesis. Levocurvature of cervicothoracic junction.  Skull base and vertebrae: No acute fracture. No primary bone lesion or focal pathologic process. Partially imaged spinal hardware extending from the T3 level off the inferior field of view.  Soft tissues and spinal canal: No prevertebral fluid or swelling. No visible canal hematoma.  Disc levels:  C2-C3: No significant disc bulge. No spinal canal stenosis or neuroforaminal narrowing.  C3-C4: No significant disc bulge. No spinal canal stenosis or neuroforaminal narrowing.  C4-C5: No significant disc bulge. No spinal canal stenosis or neuroforaminal narrowing.  C5-C6: No significant disc bulge. No spinal canal stenosis or neuroforaminal narrowing.  C6-C7: No significant disc bulge. No spinal canal stenosis or neuroforaminal narrowing.  C7-T1: No significant disc bulge. No spinal canal stenosis or neuroforaminal narrowing.  Upper chest: No focal pulmonary opacity or pleural effusion.  IMPRESSION: 1. No acute fracture or traumatic listhesis. 2. No significant disc bulge, spinal canal stenosis, or neuroforaminal narrowing.   Electronically Signed By: Donald Campion M.D. On: 02/03/2023 20:09   MR THORACIC SPINE WO CONTRAST  Narrative CLINICAL DATA:  Back pain for 4 years, history of thoracic spine surgery in 2018  EXAM: MRI CERVICAL AND THORACIC SPINE WITHOUT CONTRAST  TECHNIQUE: Multiplanar and multiecho pulse sequences of the cervical spine,  to include the craniocervical junction and cervicothoracic junction, and the thoracic spine, were obtained without intravenous contrast.  COMPARISON:  Spine radiographs 06/26/2016 (report only, images not available)  FINDINGS: MRI CERVICAL SPINE FINDINGS  Alignment: Normal.  Vertebrae: Vertebral body heights are preserved. There is no suspicious marrow signal abnormality.  Cord: Normal in signal and morphology.  Posterior Fossa, vertebral arteries, paraspinal tissues: Imaged posterior fossa is unremarkable. The paraspinal soft tissues are unremarkable. The vertebral artery flow voids are present.  Disc levels:  The intervertebral disc spaces are preserved. There is no disc herniation. There is no significant spinal canal or neural foraminal stenosis.  MRI THORACIC SPINE FINDINGS  Alignment: There is S shaped scoliosis of the thoracic spine with levoscoliosis centered at T3-T4 and dextroscoliosis centered at T8-T9. There is no antero or retrolisthesis.  Vertebrae: Postsurgical changes reflecting posterior fusion spanning from T4 through L1 are seen. Hardware alignment is grossly within expected limits. Vertebral body heights are preserved. There is no marrow signal abnormality.  Cord: No cord signal abnormality is identified. The cord is normal in morphology.  Paraspinal and other soft tissues: There are small right and trace left pleural effusions. The paraspinal soft tissues are unremarkable.  Disc levels:  The disc spaces are preserved. There is no disc herniation. There is no significant spinal canal or neural foraminal stenosis.  IMPRESSION: 1. Unremarkable cervical spine MRI. 2. Status post posterior fusion spanning from T4 through L1. There is residual S shaped scoliosis of the thoracic spine with levoscoliosis centered at T3-T4 and dextroscoliosis centered at T8-T9. 3. Otherwise, no acute findings in the thoracic spine. No significant spinal canal or  neural foraminal stenosis. 4. Small right and  trace left pleural effusions.   Electronically Signed By: Maude Harry M.D. On: 01/03/2021 09:32   Complexity Note: Imaging results reviewed.                         ROS  Cardiovascular: No reported cardiovascular signs or symptoms such as High blood pressure, coronary artery disease, abnormal heart rate or rhythm, heart attack, blood thinner therapy or heart weakness and/or failure Pulmonary or Respiratory: No reported pulmonary signs or symptoms such as wheezing and difficulty taking a deep full breath (Asthma), difficulty blowing air out (Emphysema), coughing up mucus (Bronchitis), persistent dry cough, or temporary stoppage of breathing during sleep Neurological: Curved spine Psychological-Psychiatric: Anxiousness and Depressed Gastrointestinal: No reported gastrointestinal signs or symptoms such as vomiting or evacuating blood, reflux, heartburn, alternating episodes of diarrhea and constipation, inflamed or scarred liver, or pancreas or irrregular and/or infrequent bowel movements Genitourinary: No reported renal or genitourinary signs or symptoms such as difficulty voiding or producing urine, peeing blood, non-functioning kidney, kidney stones, difficulty emptying the bladder, difficulty controlling the flow of urine, or chronic kidney disease Hematological: No reported hematological signs or symptoms such as prolonged bleeding, low or poor functioning platelets, bruising or bleeding easily, hereditary bleeding problems, low energy levels due to low hemoglobin or being anemic Endocrine: No reported endocrine signs or symptoms such as high or low blood sugar, rapid heart rate due to high thyroid levels, obesity or weight gain due to slow thyroid or thyroid disease Rheumatologic: No reported rheumatological signs and symptoms such as fatigue, joint pain, tenderness, swelling, redness, heat, stiffness, decreased range of motion, with or without  associated rash Musculoskeletal: Negative for myasthenia gravis, muscular dystrophy, multiple sclerosis or malignant hyperthermia Work History: Working full time  Allergies  Patricia Joyce has no known allergies.  Laboratory Chemistry Profile   Renal No results found for: BUN, CREATININE, LABCREA, BCR, GFR, GFRAA, GFRNONAA, SPECGRAV, PHUR, PROTEINUR   Electrolytes No results found for: NA, K, CL, CALCIUM, MG, PHOS   Hepatic No results found for: AST, ALT, ALBUMIN, ALKPHOS, AMYLASE, LIPASE, AMMONIA   ID No results found for: LYMEIGGIGMAB, HIV, SARSCOV2NAA, STAPHAUREUS, MRSAPCR, HCVAB, PREGTESTUR, RMSFIGG, QFVRPH1IGG, QFVRPH2IGG   Bone No results found for: VD25OH, [id number], CI6874NY7, CI7874NY7, 25OHVITD1, 25OHVITD2, 25OHVITD3, TESTOFREE, TESTOSTERONE   Endocrine No results found for: GLUCOSE, GLUCOSEU, HGBA1C, TSH, FREET4, TESTOFREE, TESTOSTERONE, SHBG, ESTRADIOL, ESTRADIOLPCT, ESTRADIOLFRE, LABPREG, ACTH, CRTSLPL, UCORFRPERLTR, UCORFRPERDAY, CORTISOLBASE   Neuropathy No results found for: VITAMINB12, FOLATE, HGBA1C, HIV   CNS No results found for: COLORCSF, APPEARCSF, RBCCOUNTCSF, WBCCSF, POLYSCSF, LYMPHSCSF, EOSCSF, PROTEINCSF, GLUCCSF, JCVIRUS, CSFOLI, IGGCSF, LABACHR, ACETBL   Inflammation (CRP: Acute  ESR: Chronic) No results found for: CRP, ESRSEDRATE, LATICACIDVEN   Rheumatology No results found for: RF, ANA, LABURIC, URICUR, LYMEIGGIGMAB, LYMEABIGMQN, HLAB27   Coagulation No results found for: INR, LABPROT, APTT, PLT, DDIMER, LABHEMA, VITAMINK1, AT3   Cardiovascular No results found for: BNP, CKTOTAL, CKMB, TROPONINI, HGB, HCT, LABVMA, EPIRU, EPINEPH24HUR, NOREPRU, NOREPI24HUR, DOPARU, DOPAM24HRUR   Screening No results found for: SARSCOV2NAA,  COVIDSOURCE, STAPHAUREUS, MRSAPCR, HCVAB, HIV, PREGTESTUR   Cancer No results found for: CEA, CA125, LABCA2   Allergens No results found for: ALMOND, APPLE, ASPARAGUS, AVOCADO, BANANA, BARLEY, BASIL, BAYLEAF, GREENBEAN, LIMABEAN, WHITEBEAN, BEEFIGE, REDBEET, BLUEBERRY, BROCCOLI, CABBAGE, MELON, CARROT, CASEIN, CASHEWNUT, CAULIFLOWER, CELERY     Note: Lab results reviewed.  PFSH  Drug: Patricia Joyce  has no history on file for drug use. Alcohol:  reports no history of alcohol use. Tobacco:  reports that she has never smoked. She has never used smokeless tobacco. Medical:  has a past medical history of Anxiety and Depression. Family: family history is not on file.  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 In no apparent acute distress Vitals:   02/09/23 0901  BP: 107/75  Pulse: 87  Resp: 16  Temp: (!) 97.1 F (36.2 C)  TempSrc: Temporal  SpO2: 100%  Weight: 116 lb 4.8 oz (52.8 kg)  Height: 5' 1 (1.549 m)   BMI Assessment: Estimated body mass index is 21.97 kg/m as calculated from the following:   Height as of this encounter: 5' 1 (1.549 m).   Weight as of this encounter: 116 lb 4.8 oz (52.8 kg).  BMI interpretation table: BMI level Category Range association with higher incidence of chronic pain   <18 kg/m2 Underweight   18.5-24.9 kg/m2 Ideal body weight   25-29.9 kg/m2 Overweight Increased incidence by 20%  30-34.9 kg/m2 Obese (Class I) Increased incidence by 68%  35-39.9 kg/m2 Severe obesity (Class II) Increased incidence by 136%  >40 kg/m2 Extreme obesity (Class III) Increased incidence by 254%   Patient's current BMI Ideal Body weight  Body mass index is 21.97 kg/m. Ideal body weight: 47.8 kg (105 lb 6.1 oz) Adjusted ideal body weight: 49.8 kg (109 lb 12 oz)   BMI Readings from Last 4 Encounters:  02/09/23 21.97 kg/m   Wt Readings from Last 4 Encounters:  02/09/23 116 lb 4.8 oz (52.8 kg)    Psych/Mental status: Alert, oriented x 3 (person, place, & time)       Eyes: PERLA Respiratory: No evidence of acute respiratory distress  Scar examined, thoracic to lumbar longitudinal scar 5/5  ROM 5/5 hip flexion  5 out of 5 strength bilateral lower extremity: Plantar flexion, dorsiflexion, knee flexion, knee extension.    Assessment  Primary Diagnosis & Pertinent Problem List: The primary encounter diagnosis was Myofascial pain syndrome of thoracic spine. 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  Visit Diagnosis (New problems to examiner): 1. Myofascial pain syndrome of thoracic spine   2. Adolescent idiopathic scoliosis of thoracolumbar region   3. 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 Major depressive disorder, recurrent episode, moderate (HCC)   5. 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 GAD (generalized anxiety disorder)    Plan of Care (Initial workup plan)  Note: Patricia Joyce was reminded that as per protocol, today's visit has been an evaluation only. We have not taken over the patient's  controlled substance management.  Problem-specific plan: Assessment and Plan    Chronic Back Pain Post-Spinal Fusion   Since undergoing spinal fusion surgery in  2018 for severe scoliosis, she has experienced chronic back pain, characterized as dull, aching, and constant, with exacerbations noted since June 2024. The pain, localized to the entire spine, lower occiput, left shoulder, and mid-left flank, presents with burning, pinching, and catching sensations around the left shoulder. Despite previous treatments including facet blocks, physical therapy, muscle relaxants, and Cymbalta, her discomfort persists. She is scheduled for hardware removal surgery in February 2025. The discussion covered the musculoskeletal nature of her pain, the necessity to rule out nerve compression or fractures, and the recovery process post-surgery. We will schedule trigger point injections. Monitoring for Cymbalta side effects, particularly sweating, is essential. The hardware removal surgery will proceed as planned in February 2025.  Depression   She has shown significant depression screening results. Currently on Cymbalta 60 mg for three months, its effectiveness remains uncertain, though continuation is advised to assess benefits fully. She experiences night sweats, possibly a side effect of Cymbalta.   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 We encourage continued physical therapy and scar manipulation post-surgery and provide anticipatory guidance on post-hardware removal recovery.  Follow-up   A follow-up for trigger point injections is scheduled for next week        Procedure Orders         TRIGGER POINT INJECTION     Provider-requested follow-up: Return in about 1 week (around 02/16/2023) for Left thoracic and trapezius TPI.  No future appointments.  Duration of encounter: .  Total time on encounter,  as per AMA guidelines included both the face-to-face and non-face-to-face time personally spent by the physician and/or other qualified health care professional(s) on the day of the encounter (includes time in activities that require the physician or other qualified health care professional and does not include time in activities normally performed by clinical staff). Physician's time may include the following activities when performed: Preparing to see the patient (e.g., pre-charting review of records, searching for previously ordered imaging, lab work, and nerve conduction tests) Review of prior analgesic pharmacotherapies. Reviewing PMP Interpreting ordered tests (e.g., lab work, imaging, nerve conduction tests) Performing post-procedure evaluations, including interpretation of diagnostic procedures Obtaining and/or reviewing separately obtained history Performing a medically appropriate examination and/or evaluation Counseling and educating the patient/family/caregiver Ordering medications, tests, or procedures Referring and communicating with other health care professionals (when not separately reported) Documenting clinical information in the electronic or other health record Independently interpreting results (not separately reported) and communicating results to the patient/ family/caregiver Care coordination (not separately reported)  Note by: Wallie Sherry, MD (AI and TTS technology used. I apologize for any typographical errors that were not detected and corrected.) Date: 02/09/2023; Time: 9:54 AM

## 2023-02-09 NOTE — Progress Notes (Signed)
 Safety precautions to be maintained throughout the outpatient stay will include: orient to surroundings, keep bed in low position, maintain call bell within reach at all times, provide assistance with transfer out of bed and ambulation.

## 2023-03-01 ENCOUNTER — Ambulatory Visit
Payer: BC Managed Care – PPO | Attending: Student in an Organized Health Care Education/Training Program | Admitting: Student in an Organized Health Care Education/Training Program

## 2023-03-01 ENCOUNTER — Encounter: Payer: Self-pay | Admitting: Student in an Organized Health Care Education/Training Program

## 2023-03-01 VITALS — BP 113/84 | HR 92 | Temp 97.3°F | Ht 61.0 in | Wt 120.0 lb

## 2023-03-01 DIAGNOSIS — G8928 Other chronic postprocedural pain: Secondary | ICD-10-CM | POA: Insufficient documentation

## 2023-03-01 DIAGNOSIS — M549 Dorsalgia, unspecified: Secondary | ICD-10-CM | POA: Insufficient documentation

## 2023-03-01 DIAGNOSIS — M41125 Adolescent idiopathic scoliosis, thoracolumbar region: Secondary | ICD-10-CM | POA: Insufficient documentation

## 2023-03-01 DIAGNOSIS — M7918 Myalgia, other site: Secondary | ICD-10-CM

## 2023-03-01 MED ORDER — ROPIVACAINE HCL 2 MG/ML IJ SOLN
INTRAMUSCULAR | Status: AC
  Filled 2023-03-01: qty 20

## 2023-03-01 MED ORDER — ROPIVACAINE HCL 2 MG/ML IJ SOLN
18.0000 mL | Freq: Once | INTRAMUSCULAR | Status: AC
  Administered 2023-03-01: 20 mL via PERINEURAL

## 2023-03-01 MED ORDER — DEXAMETHASONE SODIUM PHOSPHATE 10 MG/ML IJ SOLN
INTRAMUSCULAR | Status: AC
  Filled 2023-03-01: qty 1

## 2023-03-01 MED ORDER — DEXAMETHASONE SODIUM PHOSPHATE 10 MG/ML IJ SOLN
10.0000 mg | Freq: Once | INTRAMUSCULAR | Status: AC
  Administered 2023-03-01: 10 mg

## 2023-03-01 NOTE — Progress Notes (Signed)
Safety precautions to be maintained throughout the outpatient stay will include: orient to surroundings, keep bed in low position, maintain call bell within reach at all times, provide assistance with transfer out of bed and ambulation.  

## 2023-03-01 NOTE — Patient Instructions (Signed)
Trigger Point Injections Patient Information  Description: Trigger points are areas of muscle sensitive to touch which cause pain with movement, sometimes felt some distance from the site of palpation.  Usually the muscle containing these trigger points if felt as a tight band or knot.   The area of maximum tenderness or trigger point is identified, and after antiseptic preparation of the skin, a small needle is placed into this site.  Reproduction of the pain often occurs and numbing medicine (local anesthetic) is injected into the site, sometimes along with steroid preparation.  The entire block usually lasts less than 5 minutes.  Conditions which may be treated by trigger points:  Muscular pain and spasm Nerve irritation  Preparation for the injection:  Do not eat any solid food or dairy products within 8 hours of your appointment. You may drink clear liquids up to 3 hours before appointment.  Clear liquids include water, black coffee, juice or soda.  No milk or cream please. You may take your regular medications, including pain medications, with a sip of water before your appointment.  Diabetics should hold regular insulin ( if take separately) and take 1/2 normal NPH dose the morning of the procedure.  Carry some sugar containing items with you to your appointment. A driver must accompany you and be prepared to drive you home after your procedure.  Bring all your current medications with you. An IV may be inserted and sedation may be given at the discretion of the physician.  A blood pressure cuff, EKG, and other monitors will often be applied during the procedure.  Some patients may need to have extra oxygen administered for a short period. You will be asked to provide medical information, including your allergies and medications, prior to the procedure.  We must know immediately if you are taking blood thinners (like Coumadin/Warfarin) or if you are allergic to IV iodine contrast (dye).   We must know if you could possibly be pregnant.  Possible side-effects:  Bleeding from needle site Infection (rare, may require surgery) Nerve injury (rare) Numbness & tingling (temporary) Punctured lung (if injection around chest) Light-headedness (temporary) Pain at injection site (several days) Decreased blood pressure (rare, temporary) Weakness in arm/leg (temporary)  Call if you experience:  Hive or difficulty breathing (go to the emergency room) Inflammation or drainage at the injection site(s)  Please note:  Although the local anesthetic injected can often make your painful muscle feel good for several hours after the injection, the pain may return.  It takes 3-7 days for steroids to work.  You may not notice any pain relief for at least one week.  If effective, we will often do a series of injections spaced 3-6 weeks apart to maximally decrease your pain.  If you have any questions please call [phone] [facility] 

## 2023-03-01 NOTE — Progress Notes (Signed)
PROVIDER NOTE: Interpretation of information contained herein should be left to medically-trained personnel. Specific patient instructions are provided elsewhere under "Patient Instructions" section of medical record. This document was created in part using STT-dictation technology, any transcriptional errors that may result from this process are unintentional.  Patient: Patricia Joyce Type: Established DOB: July 24, 2000 MRN: [id number] PCP: Enid Baas, MD  Service: Procedure DOS: 03/01/2023 Setting: Ambulatory Location: Ambulatory outpatient facility Delivery: Face-to-face Provider: Edward Jolly, MD Specialty: Interventional Pain Management Specialty designation: 09 Location: Outpatient facility Ref. Prov.: Enid Baas, MD       Interventional Therapy   Type:  Left Trapezius and Periscapular Trigger Point Injections (Myoneural Block) (3+muscle groups)  #1 (w/ steroids)  CPT: 20552 Laterality: Left (-LT)   Imaging: N/A. Landmark-guided",           Anesthesia: Local anesthesia (1-2% Lidocaine) DOS: 03/01/2023  Performed by: Edward Jolly, MD  Medical Necessity (reasoning)  Purpose: Diagnostic/Therapeutic Rationale (medical necessity): procedure needed and proper for the diagnosis and/or treatment of Patricia Joyce's medical symptoms and needs.  1. Myofascial pain syndrome of thoracic spine   2. Adolescent idiopathic scoliosis of thoracolumbar region   3. 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 Patient assisted into a comfortable position. Pressure points checked.  Prep solution: ChloraPrep (2% chlorhexidine gluconate and 70% isopropyl alcohol) The target area was identified and the area prepped in the usual manner.  Prep Area: Left posterior cervicothoracic region  Materials:   Tray:  Block Needle(s):  Type: Regular  Gauge (G): 22  Length: 1.5-in  Qty: 1  H&P (Pre-op Assessment):  Patricia Joyce is a 23 y.o. (year old), female patient, seen today for interventional treatment. She  has a past surgical history that includes Spine surgery. Patricia Joyce has a current medication list which includes the following prescription(s): acetaminophen, bupropion, cetirizine, cyclobenzaprine, duloxetine, hydroxyzine, lidocaine, and methocarbamol, and the following Facility-Administered Medications: dexamethasone and ropivacaine (pf) 2 mg/ml (0.2%). Her primarily concern today is the Neck Pain (Left shoulder and neck)  Initial Vital Signs:  Pulse/HCG Rate: (!) 105  Temp: (!) 97.3 F (36.3 C) Resp:   BP: (!) 111/91 SpO2: 100 %  BMI: Estimated body mass index is 22.67 kg/m as calculated from the following:   Height as of this encounter: 5\' 1"  (1.549 m).   Weight as of this encounter: 120 lb (54.4 kg).  Risk Assessment: Allergies: Reviewed. She has no known allergies.  Allergy Precautions: None required Coagulopathies: Reviewed. None identified.  Blood-thinner therapy: None at this time Active Infection(s): Reviewed. None identified. Patricia Joyce is afebrile  Site Confirmation: Patricia Joyce was asked to confirm the procedure and laterality before marking the site Procedure checklist: Completed Consent: Before the procedure and under the influence of no sedative(s), amnesic(s), or anxiolytics, the patient was informed of the treatment options, risks and possible complications. To fulfill our ethical and legal obligations, as recommended by the American Medical Association's Code of Ethics, I have informed the patient of my clinical impression; the nature and purpose of the treatment or procedure; the risks, benefits, and possible complications of the intervention; the alternatives, including doing nothing; the risk(s) and benefit(s) of the alternative treatment(s) or procedure(s); and the  risk(s) and benefit(s) of doing nothing. The patient was provided information about the  general risks and possible complications associated with the procedure. These may include, but are not limited to: failure to achieve desired goals, infection, bleeding, organ or nerve damage, allergic reactions, paralysis, and death. In addition, the patient was informed of those risks and complications associated to the procedure, such as failure to decrease pain; infection; bleeding; organ or nerve damage with subsequent damage to sensory, motor, and/or autonomic systems, resulting in permanent pain, numbness, and/or weakness of one or several areas of the body; allergic reactions; (i.e.: anaphylactic reaction); and/or death. Furthermore, the patient was informed of those risks and complications associated with the medications. These include, but are not limited to: allergic reactions (i.e.: anaphylactic or anaphylactoid reaction(s)); adrenal axis suppression; blood sugar elevation that in diabetics may result in ketoacidosis or comma; water retention that in patients with history of congestive heart failure may result in shortness of breath, pulmonary edema, and decompensation with resultant heart failure; weight gain; swelling or edema; medication-induced neural toxicity; particulate matter embolism and blood vessel occlusion with resultant organ, and/or nervous system infarction; and/or aseptic necrosis of one or more joints. Finally, the patient was informed that Medicine is not an exact science; therefore, there is also the possibility of unforeseen or unpredictable risks and/or possible complications that may result in a catastrophic outcome. The patient indicated having understood very clearly. We have given the patient no guarantees and we have made no promises. Enough time was given to the patient to ask questions, all of which were answered to the patient's satisfaction. Patricia Joyce has indicated that she wanted  to continue with the procedure. Attestation: I, the ordering provider, attest that I have discussed with the patient the benefits, risks, side-effects, alternatives, likelihood of achieving goals, and potential problems during recovery for the procedure that I have provided informed consent. Date  Time: 03/01/2023 11:39 AM   Pre-Procedure Preparation:  Monitoring: As per clinic protocol. Respiration, ETCO2, SpO2, BP, heart rate and rhythm monitor placed and checked for adequate function Safety Precautions: Patient was assessed for positional comfort and pressure points before starting the procedure. Time-out: I initiated and conducted the "Time-out" before starting the procedure, as per protocol. The patient was asked to participate by confirming the accuracy of the "Time Out" information. Verification of the correct person, site, and procedure were performed and confirmed by me, the nursing staff, and the patient. "Time-out" conducted as per Joint Commission's Universal Protocol (UP.01.01.01). Time: 1155 Start Time: 1155 hrs.   Narrative                Start Time: 1155 hrs.  Approx 15 trigger points injected with needling performed Each TP injected with 0.5-1 cc of neve block solution (14 cc of 0.2% Ropivacaine, 1 cc of Decadron 10 mg/cc)  Vitals:   03/01/23 1142 03/01/23 1143 03/01/23 1204  BP:  (!) 111/91 113/84  Pulse:  (!) 105 92  Temp:   (!) 97.3 F (36.3 C)  SpO2:  100% 100%  Weight: 120 lb (54.4 kg)    Height: 5\' 1"  (1.549 m)       End Time: 1201 hrs.  Post-operative Assessment:  Post-procedure Vital Signs:  Pulse/HCG Rate: 92  Temp: (!) 97.3 F (36.3 C) Resp:   BP: 113/84 SpO2: 100 %  EBL: None  Complications: No immediate post-treatment complications observed by team, or reported by patient.  Note: The patient tolerated the entire procedure well. A repeat set of vitals were taken after the procedure and the patient was kept under observation  following  institutional policy, for this type of procedure. Post-procedural neurological assessment was performed, showing return to baseline, prior to discharge. The patient was provided with post-procedure discharge instructions, including a section on how to identify potential problems. Should any problems arise concerning this procedure, the patient was given instructions to immediately contact us, at any time, without hesitation. In any case, we plan to contact the patient by telephone for a follow-up status report regarding this interventional procedure.  Comments:  No additional relevant information.   Plan of Care (POC)  Orders:  No orders of the defined types were placed in this encounter.    Medications ordered for procedure: Meds ordered this encounter  Medications   dexamethasone (DECADRON) injection 10 mg   ropivacaine (PF) 2 mg/mL (0.2%) (NAROPIN) injection 18 mL   Medications administered: Earlie Counts. Luber had no medications administered during this visit.  See the medical record for exact dosing, route, and time of administration.  Follow-up plan:   Return in about 16 days (around 03/17/2023) for PPE, VV.       Recent Visits Date Type Provider Dept  02/09/23 Office Visit Edward Jolly, MD [facility]  Showing recent visits within past 90 days and meeting all other requirements Today's Visits Date Type Provider Dept  03/01/23 Procedure visit Edward Jolly, MD [facility]  Showing today's visits and meeting all other requirements Future Appointments Date Type Provider Dept  03/17/23 Appointment Edward Jolly, MD [facility]  Showing future appointments within next 90 days and meeting all other requirements  Disposition: Discharge home  Discharge (Date  Time): 03/01/2023; 1202 hrs.   Primary Care Physician: Enid Baas, MD Location: [facility] Note by: Edward Jolly, MD (TTS technology used. I apologize for  any typographical errors that were not detected and corrected.) Date: 03/01/2023; Time: 1:11 PM  Disclaimer:  Medicine is not an Visual merchandiser. The only guarantee in medicine is that nothing is guaranteed. It is important to note that the decision to proceed with this intervention was based on the information collected from the patient. The Data and conclusions were drawn from the patient's questionnaire, the interview, and the physical examination. Because the information was provided in large part by the patient, it cannot be guaranteed that it has not been purposely or unconsciously manipulated. Every effort has been made to obtain as much relevant data as possible for this evaluation. It is important to note that the conclusions that lead to this procedure are derived in large part from the available data. Always take into account that the treatment will also be dependent on availability of resources and existing treatment guidelines, considered by other Pain Management Practitioners as being common knowledge and practice, at the time of the intervention. For Medico-Legal purposes, it is also important to point out that variation in procedural techniques and pharmacological choices are the acceptable norm. The indications, contraindications, technique, and results of the above procedure should only be interpreted and judged by a Board-Certified Interventional Pain Specialist with extensive familiarity and expertise in the same exact procedure and technique.

## 2023-03-02 ENCOUNTER — Telehealth: Payer: Self-pay | Admitting: *Deleted

## 2023-03-02 NOTE — Telephone Encounter (Signed)
No problems post procedure. 

## 2023-03-17 ENCOUNTER — Telehealth: Payer: BC Managed Care – PPO | Admitting: Student in an Organized Health Care Education/Training Program
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
## 2023-06-29 ENCOUNTER — Ambulatory Visit: Attending: Student in an Organized Health Care Education/Training Program | Admitting: Nurse Practitioner

## 2023-06-29 ENCOUNTER — Encounter: Payer: Self-pay | Admitting: Nurse Practitioner

## 2023-06-29 VITALS — BP 114/82 | HR 85 | Temp 98.4°F | Resp 16 | Ht 61.5 in | Wt 142.1 lb

## 2023-06-29 DIAGNOSIS — M7918 Myalgia, other site: Secondary | ICD-10-CM | POA: Diagnosis present

## 2023-06-29 DIAGNOSIS — G8928 Other chronic postprocedural pain: Secondary | ICD-10-CM | POA: Diagnosis present

## 2023-06-29 DIAGNOSIS — F331 Major depressive disorder, recurrent, moderate: Secondary | ICD-10-CM | POA: Diagnosis present

## 2023-06-29 DIAGNOSIS — F411 Generalized anxiety disorder: Secondary | ICD-10-CM | POA: Diagnosis present

## 2023-06-29 DIAGNOSIS — M549 Dorsalgia, unspecified: Secondary | ICD-10-CM | POA: Insufficient documentation

## 2023-06-29 DIAGNOSIS — M4325 Fusion of spine, thoracolumbar region: Secondary | ICD-10-CM | POA: Diagnosis present

## 2023-06-29 DIAGNOSIS — M41125 Adolescent idiopathic scoliosis, thoracolumbar region: Secondary | ICD-10-CM | POA: Diagnosis present

## 2023-06-29 NOTE — Progress Notes (Signed)
 Safety precautions to be maintained throughout the outpatient stay will include: orient to surroundings, keep bed in low position, maintain call bell within reach at all times, provide assistance with transfer out of bed and ambulation.

## 2023-06-29 NOTE — Patient Instructions (Signed)

## 2023-06-30 ENCOUNTER — Ambulatory Visit
Attending: Student in an Organized Health Care Education/Training Program | Admitting: Student in an Organized Health Care Education/Training Program

## 2023-06-30 VITALS — BP 109/71 | Temp 97.3°F | Resp 16 | Ht 61.5 in | Wt 142.0 lb

## 2023-06-30 DIAGNOSIS — M7918 Myalgia, other site: Secondary | ICD-10-CM

## 2023-06-30 MED ORDER — DEXAMETHASONE SODIUM PHOSPHATE 10 MG/ML IJ SOLN
10.0000 mg | Freq: Once | INTRAMUSCULAR | Status: AC
  Administered 2023-06-30: 10 mg

## 2023-06-30 MED ORDER — ROPIVACAINE HCL 2 MG/ML IJ SOLN
9.0000 mL | Freq: Once | INTRAMUSCULAR | Status: AC
  Administered 2023-06-30: 9 mL via PERINEURAL

## 2023-06-30 MED ORDER — ROPIVACAINE HCL 2 MG/ML IJ SOLN
INTRAMUSCULAR | Status: AC
  Filled 2023-06-30: qty 20

## 2023-06-30 MED ORDER — DEXAMETHASONE SODIUM PHOSPHATE 10 MG/ML IJ SOLN
INTRAMUSCULAR | Status: AC
  Filled 2023-06-30: qty 2

## 2023-06-30 NOTE — Patient Instructions (Signed)

## 2023-06-30 NOTE — Progress Notes (Signed)
 PROVIDER NOTE: Interpretation of information contained herein should be left to medically-trained personnel. Specific patient instructions are provided elsewhere under "Patient Instructions" section of medical record. This document was created in part using STT-dictation technology, any transcriptional errors that may result from this process are unintentional.  Patient: Patricia Joyce Type: Established DOB: September 12, 2000 MRN: [id number] PCP: Rex Castor, MD  Service: Procedure DOS: 06/30/2023 Setting: Ambulatory Location: Ambulatory outpatient facility Delivery: Face-to-face Provider: Cephus Collin, MD Specialty: Interventional Pain Management Specialty designation: 09 Location: Outpatient facility Ref. Prov.: Rex Castor, MD       Interventional Therapy   Type:  Left Trapezius and Periscapular Trigger Point Injections (Myoneural Block) (3+muscle groups)  #1 (w/ steroids)  CPT: 20552 Laterality: Left (-LT)   Imaging: N/A. Landmark-guided",           Anesthesia: Local anesthesia (1-2% Lidocaine) DOS: 06/30/2023  Performed by: Decklyn Hornik, MD  Medical Necessity (reasoning)  Purpose: Diagnostic/Therapeutic Rationale (medical necessity): procedure needed and proper for the diagnosis and/or treatment of Patricia Joyce's medical symptoms and needs.  1. Myofascial pain syndrome of thoracic spine    NAS-11 Pain score:   Pre-procedure: 6 /10   Post-procedure: (P) 3  (putting on shirts)/10        Approach: Percutaneous  Type of procedure: Myoneural injection   Position  Prep  Materials  Position: Sitting. Patient assisted into a comfortable position. Pressure points checked.  Prep solution: ChloraPrep (2% chlorhexidine gluconate and 70% isopropyl alcohol) The target area was identified and the area prepped in the usual manner.  Prep Area: Left posterior cervicothoracic region  Materials:   Tray: Block Needle(s):  Type: Regular  Gauge (G): 22  Length: 1.5-in  Qty:  1  H&P (Pre-op Assessment):  Patricia Joyce is a 23 y.o. (year old), female patient, seen today for interventional treatment. She  has a past surgical history that includes Spine surgery. Patricia Joyce has a current medication list which includes the following prescription(s): acetaminophen, benztropine, cetirizine, hydroxyzine, ibuprofen, lidocaine, methocarbamol, risperidone, cyclobenzaprine, and duloxetine. Her primarily concern today is the Shoulder Pain (Left/) and Neck Pain  Initial Vital Signs:  Pulse/HCG Rate:    Temp: (!) 97.3 F (36.3 C) Resp: 16 BP: 109/71 SpO2: 100 %  BMI: Estimated body mass index is 26.4 kg/m as calculated from the following:   Height as of this encounter: 5' 1.5" (1.562 m).   Weight as of this encounter: 142 lb (64.4 kg).  Risk Assessment: Allergies: Reviewed. She has no known allergies.  Allergy Precautions: None required Coagulopathies: Reviewed. None identified.  Blood-thinner therapy: None at this time Active Infection(s): Reviewed. None identified. Patricia Joyce is afebrile  Site Confirmation: Patricia Joyce was asked to confirm the procedure and laterality before marking the site Procedure checklist: Completed Consent: Before the procedure and under the influence of no sedative(s), amnesic(s), or anxiolytics, the patient was informed of the treatment options, risks and possible complications. To fulfill our ethical and legal obligations, as recommended by the American Medical Association's Code of Ethics, I have informed the patient of my clinical impression; the nature and purpose of the treatment or procedure; the risks, benefits, and possible complications of the intervention; the alternatives, including doing nothing; the risk(s) and benefit(s) of the alternative treatment(s) or procedure(s); and the risk(s) and benefit(s) of doing nothing. The patient was provided information about the general risks and possible complications associated with the procedure.  These may include, but are not limited to: failure to achieve desired goals, infection, bleeding, organ  or nerve damage, allergic reactions, paralysis, and death. In addition, the patient was informed of those risks and complications associated to the procedure, such as failure to decrease pain; infection; bleeding; organ or nerve damage with subsequent damage to sensory, motor, and/or autonomic systems, resulting in permanent pain, numbness, and/or weakness of one or several areas of the body; allergic reactions; (i.e.: anaphylactic reaction); and/or death. Furthermore, the patient was informed of those risks and complications associated with the medications. These include, but are not limited to: allergic reactions (i.e.: anaphylactic or anaphylactoid reaction(s)); adrenal axis suppression; blood sugar elevation that in diabetics may result in ketoacidosis or comma; water retention that in patients with history of congestive heart failure may result in shortness of breath, pulmonary edema, and decompensation with resultant heart failure; weight gain; swelling or edema; medication-induced neural toxicity; particulate matter embolism and blood vessel occlusion with resultant organ, and/or nervous system infarction; and/or aseptic necrosis of one or more joints. Finally, the patient was informed that Medicine is not an exact science; therefore, there is also the possibility of unforeseen or unpredictable risks and/or possible complications that may result in a catastrophic outcome. The patient indicated having understood very clearly. We have given the patient no guarantees and we have made no promises. Enough time was given to the patient to ask questions, all of which were answered to the patient's satisfaction. Patricia Joyce has indicated that she wanted to continue with the procedure. Attestation: I, the ordering provider, attest that I have discussed with the patient the benefits, risks, side-effects,  alternatives, likelihood of achieving goals, and potential problems during recovery for the procedure that I have provided informed consent. Date  Time: 06/30/2023  9:32 AM   Pre-Procedure Preparation:  Monitoring: As per clinic protocol. Respiration, ETCO2, SpO2, BP, heart rate and rhythm monitor placed and checked for adequate function Safety Precautions: Patient was assessed for positional comfort and pressure points before starting the procedure. Time-out: I initiated and conducted the "Time-out" before starting the procedure, as per protocol. The patient was asked to participate by confirming the accuracy of the "Time Out" information. Verification of the correct person, site, and procedure were performed and confirmed by me, the nursing staff, and the patient. "Time-out" conducted as per Joint Commission's Universal Protocol (UP.01.01.01). Time: 0955 Start Time: 0955 hrs.   Narrative                Start Time: 0955 hrs.  Approx 15 trigger points injected with needling performed Each TP injected with 0.5-1 cc of neve block solution (14 cc of 0.2% Ropivacaine , 1 cc of Decadron  10 mg/cc)  Vitals:   06/30/23 0936  BP: 109/71  Resp: 16  Temp: (!) 97.3 F (36.3 C)  TempSrc: Temporal  SpO2: 100%  Weight: 142 lb (64.4 kg)  Height: 5' 1.5" (1.562 m)     End Time: 0959 hrs.  Post-operative Assessment:  Post-procedure Vital Signs:  Pulse/HCG Rate:    Temp: (!) 97.3 F (36.3 C) Resp: 16 BP: 109/71 SpO2: 100 %  EBL: None  Complications: No immediate post-treatment complications observed by team, or reported by patient.  Note: The patient tolerated the entire procedure well. A repeat set of vitals were taken after the procedure and the patient was kept under observation following institutional policy, for this type of procedure. Post-procedural neurological assessment was performed, showing return to baseline, prior to discharge. The patient was provided with post-procedure  discharge instructions, including a section on how to identify potential problems. Should  any problems arise concerning this procedure, the patient was given instructions to immediately contact us , at any time, without hesitation. In any case, we plan to contact the patient by telephone for a follow-up status report regarding this interventional procedure.  Comments:  No additional relevant information.   Plan of Care (POC)  Orders:  No orders of the defined types were placed in this encounter.    Medications ordered for procedure: Meds ordered this encounter  Medications   ropivacaine  (PF) 2 mg/mL (0.2%) (NAROPIN ) injection 9 mL   dexamethasone  (DECADRON ) injection 10 mg   Medications administered: We administered ropivacaine  (PF) 2 mg/mL (0.2%) and dexamethasone .  See the medical record for exact dosing, route, and time of administration.  Follow-up plan:   Return in about 6 weeks (around 08/11/2023) for PPE, VV.       Recent Visits Date Type Provider Dept  06/29/23 Office Visit Patel, Seema K, NP [facility]  Showing recent visits within past 90 days and meeting all other requirements Today's Visits Date Type Provider Dept  06/30/23 Procedure visit Cephus Collin, MD [facility]  Showing today's visits and meeting all other requirements Future Appointments Date Type Provider Dept  08/02/23 Appointment Cephus Collin, MD [facility]  Showing future appointments within next 90 days and meeting all other requirements  Disposition: Discharge home  Discharge (Date  Time): 06/30/2023;   hrs.   Primary Care Physician: Rex Castor, MD Location: [facility] Note by: Cephus Collin, MD (TTS technology used. I apologize for any typographical errors that were not detected and corrected.) Date: 06/30/2023; Time: 10:23 AM  Disclaimer:  Medicine is not an Visual merchandiser. The only guarantee in medicine is that nothing is  guaranteed. It is important to note that the decision to proceed with this intervention was based on the information collected from the patient. The Data and conclusions were drawn from the patient's questionnaire, the interview, and the physical examination. Because the information was provided in large part by the patient, it cannot be guaranteed that it has not been purposely or unconsciously manipulated. Every effort has been made to obtain as much relevant data as possible for this evaluation. It is important to note that the conclusions that lead to this procedure are derived in large part from the available data. Always take into account that the treatment will also be dependent on availability of resources and existing treatment guidelines, considered by other Pain Management Practitioners as being common knowledge and practice, at the time of the intervention. For Medico-Legal purposes, it is also important to point out that variation in procedural techniques and pharmacological choices are the acceptable norm. The indications, contraindications, technique, and results of the above procedure should only be interpreted and judged by a Board-Certified Interventional Pain Specialist with extensive familiarity and expertise in the same exact procedure and technique.

## 2023-06-30 NOTE — Progress Notes (Signed)
 Safety precautions to be maintained throughout the outpatient stay will include: orient to surroundings, keep bed in low position, maintain call bell within reach at all times, provide assistance with transfer out of bed and ambulation.

## 2023-07-01 ENCOUNTER — Telehealth: Payer: Self-pay

## 2023-07-01 NOTE — Telephone Encounter (Signed)
 Post procedure follow up.  Patient states she is doing good.

## 2023-07-30 ENCOUNTER — Encounter: Payer: Self-pay | Admitting: Student in an Organized Health Care Education/Training Program

## 2023-08-02 ENCOUNTER — Encounter: Payer: Self-pay | Admitting: Student in an Organized Health Care Education/Training Program

## 2023-08-02 ENCOUNTER — Ambulatory Visit
Attending: Student in an Organized Health Care Education/Training Program | Admitting: Student in an Organized Health Care Education/Training Program

## 2023-08-02 DIAGNOSIS — M549 Dorsalgia, unspecified: Secondary | ICD-10-CM | POA: Diagnosis not present

## 2023-08-02 DIAGNOSIS — M41125 Adolescent idiopathic scoliosis, thoracolumbar region: Secondary | ICD-10-CM | POA: Diagnosis not present

## 2023-08-02 DIAGNOSIS — G8928 Other chronic postprocedural pain: Secondary | ICD-10-CM

## 2023-08-02 DIAGNOSIS — M7918 Myalgia, other site: Secondary | ICD-10-CM

## 2023-08-02 NOTE — Progress Notes (Signed)
 PROVIDER NOTE: Interpretation of information contained herein should be left to medically-trained personnel. Specific patient instructions are provided elsewhere under Patient Instructions section of medical record. This document was created in part using AI and STT-dictation technology, any transcriptional errors that may result from this process are unintentional.  Patient: Patricia Joyce  Service: E/M   PCP: Sherial Bail, MD  DOB: 01-12-2001  DOS: 08/02/2023  Provider: Wallie Sherry, MD  MRN: [id number]  Delivery: Virtual Visit  Specialty: Interventional Pain Management  Type: Established Patient  Setting: Ambulatory outpatient facility  Specialty designation: 09  Referring Prov.: Sherial Bail, MD  Location: Remote location       Virtual Encounter - Pain Management PROVIDER NOTE: Information contained herein reflects review and annotations entered in association with encounter. Interpretation of such information and data should be left to medically-trained personnel. Information provided to patient can be located elsewhere in the medical record under Patient Instructions. Document created using STT-dictation technology, any transcriptional errors that may result from process are unintentional.    Contact & Pharmacy Preferred: [phone] Home: [phone] (home) Mobile: [phone] (mobile) E-mail: [id number] - [place], [place] - [address] [place] [address] [place] [address] Phone: [phone] Fax: [phone]   Pre-screening  Patricia Joyce offered in-person vs virtual encounter. She indicated preferring virtual for this encounter.   Reason COVID-19*  Social distancing based on CDC and AMA recommendations.   I contacted Patricia Joyce on 08/02/2023 via telephone.      I clearly identified myself as Wallie Sherry, MD. I verified that I was speaking with the correct person using two identifiers (Name: Patricia Joyce, and date of  birth: 2000/10/21).  Consent I sought verbal advanced consent from Patricia Joyce for virtual visit interactions. I informed Patricia Joyce of possible security and privacy concerns, risks, and limitations associated with providing not-in-person medical evaluation and management services. I also informed Patricia Joyce of the availability of in-person appointments. Finally, I informed her that there would be a charge for the virtual visit and that she could be  personally, fully or partially, financially responsible for it. Patricia Joyce expressed understanding and agreed to proceed.   Historic Elements   Patricia Joyce is a 23 y.o. year old, female patient evaluated today after our last contact on 06/30/2023. Patricia Joyce  has a past medical history of Anxiety and Depression. She also  has a past surgical history that includes Spine surgery. Patricia Joyce has a current medication list which includes the following prescription(s): acetaminophen, benztropine, cetirizine, hydroxyzine, ibuprofen, lidocaine, methocarbamol, and risperidone. She  reports that she has never smoked. She has never used smokeless tobacco. She reports that she does not drink alcohol. No history on file for drug use. Patricia Joyce has no known allergies.  BMI: Estimated body mass index is 26.4 kg/m as calculated from the following:   Height as of 06/30/23: 5' 1.5 (1.562 m).   Weight as of 06/30/23: 142 lb (64.4 kg). Last encounter: 02/09/2023. Last procedure: 06/30/2023.  HPI  Today, she is being contacted for   Post-Procedure Evaluation   Type:  Left Trapezius and Periscapular Trigger Point Injections (Myoneural Block) (3+muscle groups)  #1 (w/ steroids)  CPT: 20552 Laterality: Left (-LT)   Imaging: N/A. Landmark-guided,           Anesthesia: Local anesthesia (1-2% Lidocaine) DOS: 06/30/2023  Performed by: Wallie Sherry, MD  Medical Necessity (reasoning)  Purpose: Diagnostic/Therapeutic Rationale (medical necessity): procedure  needed and proper for the diagnosis and/or treatment of Patricia Joyce medical symptoms and needs.  1. Myofascial pain syndrome of thoracic spine    NAS-11 Pain score:   Pre-procedure: 6 /10   Post-procedure: (P) 3  (putting on shirts)/10       Effectiveness:  Initial hour after procedure: 100 %  Subsequent 4-6 hours post-procedure: 100 %  Analgesia past initial 6 hours: 0 %  Ongoing improvement:  Analgesic:  0%  Laboratory Chemistry Profile   Renal No results found for: BUN, CREATININE, LABCREA, BCR, GFR, GFRAA, GFRNONAA, LABVMA, EPIRU, EPINEPH24HUR, NOREPRU, NOREPI24HUR, DOPARU, INEJF75YMLM  Hepatic No results found for: AST, ALT, ALBUMIN, ALKPHOS, HCVAB, AMYLASE, LIPASE, AMMONIA  Electrolytes No results found for: NA, K, CL, CALCIUM, MG, PHOS  Bone No results found for: VD25OH, [id number], CI6874NY7, CI7874NY7, 25OHVITD1, 25OHVITD2, 25OHVITD3, TESTOFREE, TESTOSTERONE  Inflammation (CRP: Acute Phase) (ESR: Chronic Phase) No results found for: CRP, ESRSEDRATE, LATICACIDVEN       Note: Above Lab results reviewed.  Imaging  CT CERVICAL SPINE WO CONTRAST CLINICAL DATA:  Cervicalgia, left neck pain with some left hand tingling  EXAM: CT CERVICAL SPINE WITHOUT CONTRAST  TECHNIQUE: Multidetector CT imaging of the cervical spine was performed without intravenous contrast. Multiplanar CT image reconstructions were also generated.  RADIATION DOSE REDUCTION: This exam was performed according to the departmental dose-optimization program which includes automated exposure control, adjustment of the mA and/or kV according to patient size and/or use of iterative reconstruction technique.  COMPARISON:  No prior CT of the cervical spine available, correlation is made with MRI cervical spine 01/01/2021  FINDINGS: Alignment: Straightening of the normal cervical lordosis. No listhesis. Levocurvature of  cervicothoracic junction.  Skull base and vertebrae: No acute fracture. No primary bone lesion or focal pathologic process. Partially imaged spinal hardware extending from the T3 level off the inferior field of view.  Soft tissues and spinal canal: No prevertebral fluid or swelling. No visible canal hematoma.  Disc levels:  C2-C3: No significant disc bulge. No spinal canal stenosis or neuroforaminal narrowing.  C3-C4: No significant disc bulge. No spinal canal stenosis or neuroforaminal narrowing.  C4-C5: No significant disc bulge. No spinal canal stenosis or neuroforaminal narrowing.  C5-C6: No significant disc bulge. No spinal canal stenosis or neuroforaminal narrowing.  C6-C7: No significant disc bulge. No spinal canal stenosis or neuroforaminal narrowing.  C7-T1: No significant disc bulge. No spinal canal stenosis or neuroforaminal narrowing.  Upper chest: No focal pulmonary opacity or pleural effusion.  IMPRESSION: 1. No acute fracture or traumatic listhesis. 2. No significant disc bulge, spinal canal stenosis, or neuroforaminal narrowing.  Electronically Signed   By: Donald Campion M.D.   On: 02/03/2023 20:09  Assessment  The primary encounter diagnosis was Myofascial pain syndrome of thoracic spine. 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  Plan of Care  Unfortunately, no benefit with previous trigger point injection.  Patient has significant T4-L1 spinal fusion with scoliosis.  She is not interested in any additional medications.  We did discuss Neurontin and pregabalin, both of which she has tried before with limited response.  I do not have any other treatment recommendations for her at this time.  She should follow-up with her orthopedic surgery team and primary care provider.  Follow-up plan:   No follow-ups on file.      Recent Visits Date Type Provider  Dept  06/30/23 Procedure visit Marcelino Nurse, MD [facility]  06/29/23 Office Visit Patel, Seema K, NP [facility]  Showing recent visits within past 90 days and meeting all other requirements Today's Visits Date Type Provider Dept  08/02/23 Office Visit Marcelino Nurse, MD [facility]  Showing today's visits and meeting all other requirements Future Appointments No visits were found meeting these conditions. Showing future appointments within next 90 days and meeting all other requirements  I discussed the assessment and treatment plan with the patient. The patient was provided an opportunity to ask questions and all were answered. The patient agreed with the plan and demonstrated an understanding of the instructions.  Patient advised to call back or seek an in-person evaluation if the symptoms or condition worsens.  Duration of encounter: .  Note by: Nurse Marcelino, MD Date: 08/02/2023; Time: 4:33 PM
